# Patient Record
Sex: Female | Born: 1990 | Race: Black or African American | Hispanic: No | Marital: Single | Smoking: Current every day smoker
Health system: Southern US, Community
[De-identification: ages and names within clinical notes are randomized; demographics above are authoritative.]

## PROBLEM LIST (undated history)

## (undated) DIAGNOSIS — F32A Depression, unspecified: Secondary | ICD-10-CM

## (undated) DIAGNOSIS — F909 Attention-deficit hyperactivity disorder, unspecified type: Secondary | ICD-10-CM

## (undated) DIAGNOSIS — F329 Major depressive disorder, single episode, unspecified: Secondary | ICD-10-CM

---

## 2014-10-16 ENCOUNTER — Emergency Department (HOSPITAL_COMMUNITY)
Admission: EM | Admit: 2014-10-16 | Discharge: 2014-10-17 | Disposition: A | Discharge status: Home / Self Care | Payer: Self-pay | Attending: Emergency Medicine | Admitting: Emergency Medicine

## 2014-10-16 ENCOUNTER — Encounter (HOSPITAL_COMMUNITY): Payer: Self-pay | Admitting: Adult Health

## 2014-10-16 DIAGNOSIS — Z202 Contact with and (suspected) exposure to infections with a predominantly sexual mode of transmission: Secondary | ICD-10-CM | POA: Insufficient documentation

## 2014-10-16 DIAGNOSIS — A5901 Trichomonal vulvovaginitis: Secondary | ICD-10-CM | POA: Insufficient documentation

## 2014-10-16 DIAGNOSIS — N898 Other specified noninflammatory disorders of vagina: Secondary | ICD-10-CM

## 2014-10-16 DIAGNOSIS — K088 Other specified disorders of teeth and supporting structures: Secondary | ICD-10-CM | POA: Insufficient documentation

## 2014-10-16 DIAGNOSIS — Z720 Tobacco use: Secondary | ICD-10-CM | POA: Insufficient documentation

## 2014-10-16 DIAGNOSIS — A599 Trichomoniasis, unspecified: Secondary | ICD-10-CM

## 2014-10-16 DIAGNOSIS — Z3202 Encounter for pregnancy test, result negative: Secondary | ICD-10-CM | POA: Insufficient documentation

## 2014-10-16 DIAGNOSIS — K029 Dental caries, unspecified: Secondary | ICD-10-CM | POA: Insufficient documentation

## 2014-10-16 DIAGNOSIS — A64 Unspecified sexually transmitted disease: Secondary | ICD-10-CM | POA: Insufficient documentation

## 2014-10-16 LAB — POC URINE PREG, ED: Preg Test, Ur: NEGATIVE

## 2014-10-16 MED ORDER — LIDOCAINE HCL (PF) 1 % IJ SOLN
INTRAMUSCULAR | Status: AC
  Filled 2014-10-16: qty 5

## 2014-10-16 MED ORDER — CEFTRIAXONE SODIUM 250 MG IJ SOLR
250.0000 mg | Freq: Once | INTRAMUSCULAR | Status: AC
  Administered 2014-10-16: 250 mg via INTRAMUSCULAR
  Filled 2014-10-16: qty 250

## 2014-10-16 MED ORDER — AZITHROMYCIN 250 MG PO TABS
1000.0000 mg | ORAL_TABLET | Freq: Once | ORAL | Status: AC
  Administered 2014-10-16: 1000 mg via ORAL
  Filled 2014-10-16: qty 4

## 2014-10-16 MED ORDER — HYDROCODONE-ACETAMINOPHEN 5-325 MG PO TABS
2.0000 | ORAL_TABLET | Freq: Once | ORAL | Status: AC
  Administered 2014-10-16: 2 via ORAL
  Filled 2014-10-16: qty 2

## 2014-10-16 NOTE — ED Notes (Signed)
Presents with 2 weeks of white, foul odor, vaginal discharge and lower abdominal pain. Last period one week ago. Also c/o right lower dental pain. Denies urinary symptoms.

## 2014-10-16 NOTE — ED Provider Notes (Signed)
CSN: [id number]     Arrival date & time 10/16/14  1917 History   First MD Initiated Contact with Patient 10/16/14 2305     Chief Complaint  Patient presents with  . Vaginal Discharge  . Dental Pain     (Consider location/radiation/quality/duration/timing/severity/associated sxs/prior Treatment) The history is provided by the patient and medical records. No language interpreter was used.     Andrea Guerra is a 24 y.o. female  with no major medical problems presents to the Emergency Department complaining of gradual, persistent, progressively worsening vaginal discharge onset 2 weeks ago.  Pt reports discharge is clear/white and has an odor.  Pt reports that 3 days ago she was informed by her sexual partner that he has Chlamydia.  Pt reports previous hx of STD 1 year ago.  Pt denies fever, chills, nausea, vomiting, abd pain, diarrhea.  LMP:  1 week ago.  No aggravating or alleviating factors.    Pt also c/o right lower dental pain onset several days ago.  Pt reports that she took some of her father's penicillin and percocet that helped with the swelling and pain.  Pt reports the tooth in question is broken and has been for some time.  Pt reports she has not seen a dentist in > 2 years.  Eating makes the symptoms worst.  Pt reports associated right sided facial swelling that is improving.    History reviewed. No pertinent past medical history. History reviewed. No pertinent past surgical history. History reviewed. No pertinent family history. History  Substance Use Topics  . Smoking status: Current Every Day Smoker    Types: Cigarettes  . Smokeless tobacco: Not on file  . Alcohol Use: No   OB History    No data available     Review of Systems  Constitutional: Negative for fever, chills, diaphoresis, appetite change, fatigue and unexpected weight change.  HENT: Positive for dental problem and facial swelling. Negative for drooling, ear pain, mouth sores, nosebleeds, postnasal drip,  rhinorrhea and trouble swallowing.   Eyes: Negative for pain, redness and visual disturbance.  Respiratory: Negative for cough, chest tightness, shortness of breath and wheezing.   Cardiovascular: Negative for chest pain.  Gastrointestinal: Negative for nausea, vomiting, abdominal pain, diarrhea and constipation.  Endocrine: Negative for polydipsia, polyphagia and polyuria.  Genitourinary: Positive for vaginal discharge. Negative for dysuria, urgency, frequency and hematuria.  Musculoskeletal: Negative for back pain, neck pain and neck stiffness.  Skin: Negative for color change and rash.  Allergic/Immunologic: Negative for immunocompromised state.  Neurological: Negative for syncope, weakness, light-headedness and headaches.  Hematological: Does not bruise/bleed easily.  Psychiatric/Behavioral: Negative for sleep disturbance. The patient is not nervous/anxious.   All other systems reviewed and are negative.     Allergies  Review of patient's allergies indicates no known allergies.  Home Medications   Prior to Admission medications   Medication Sig Start Date End Date Taking? Authorizing Provider  HYDROcodone-acetaminophen (NORCO/VICODIN) 5-325 MG per tablet Take 1-2 tablets by mouth every 4 (four) hours as needed. 10/17/14   Jeralynn Vaquera, PA-C  penicillin v potassium (VEETID) 500 MG tablet Take 1 tablet (500 mg total) by mouth 3 (three) times daily. 10/17/14   Aymee Fomby, PA-C   BP 107/63 mmHg  Pulse 63  Temp(Src) 98.4 F (36.9 C) (Oral)  Resp 16  Ht  (1.6 m)  SpO2 99%  LMP 10/10/2014 (Approximate) Physical Exam  Constitutional: She appears well-developed and well-nourished. No distress.  HENT:  Head: Normocephalic and atraumatic.  Right Ear: Tympanic membrane, external ear and ear canal normal.  Left Ear: Tympanic membrane, external ear and ear canal normal.  Nose: Nose normal. Right sinus exhibits no maxillary sinus tenderness and no frontal sinus  tenderness. Left sinus exhibits no maxillary sinus tenderness and no frontal sinus tenderness.  Mouth/Throat: Uvula is midline, oropharynx is clear and moist and mucous membranes are normal. No oral lesions. Abnormal dentition. Dental caries present. No uvula swelling or lacerations. No oropharyngeal exudate, posterior oropharyngeal edema, posterior oropharyngeal erythema or tonsillar abscesses.  No gingival swelling, fluctuance or induration; no erythema of the gingiva No gross abscess Tooth #1 and 32 with significant decay from dental caries  Eyes: Conjunctivae are normal. Pupils are equal, round, and reactive to light. Right eye exhibits no discharge. Left eye exhibits no discharge.  Neck: Normal range of motion. Neck supple.  No stridor Handling secretions without difficulty No nuchal rigidity No cervical lymphadenopathy   Cardiovascular: Normal rate, regular rhythm, normal heart sounds and intact distal pulses.   No murmur heard. Pulmonary/Chest: Effort normal and breath sounds normal. No respiratory distress. She has no wheezes.  Equal chest rise  Abdominal: Soft. Bowel sounds are normal. She exhibits no distension. There is no tenderness. There is no rebound and no guarding. Hernia confirmed negative in the right inguinal area and confirmed negative in the left inguinal area.  Genitourinary: Uterus normal. No labial fusion. There is no rash, tenderness or lesion on the right labia. There is no rash, tenderness or lesion on the left labia. Uterus is not deviated, not enlarged, not fixed and not tender. Cervix exhibits no motion tenderness, no discharge and no friability. Right adnexum displays no mass, no tenderness and no fullness. Left adnexum displays no mass, no tenderness and no fullness. No erythema, tenderness or bleeding in the vagina. No foreign body around the vagina. No signs of injury around the vagina. Vaginal discharge (Copious, thin, white/green, malodorous) found.   Musculoskeletal: Normal range of motion. She exhibits no edema.  Lymphadenopathy:    She has no cervical adenopathy.       Right: No inguinal adenopathy present.       Left: No inguinal adenopathy present.  Neurological: She is alert.  Skin: Skin is warm and dry. She is not diaphoretic. No erythema.  Psychiatric: She has a normal mood and affect.  Nursing note and vitals reviewed.   ED Course  Procedures (including critical care time) Labs Review Labs Reviewed  WET PREP, GENITAL - Abnormal; Notable for the following:    Trich, Wet Prep MODERATE (*)    WBC, Wet Prep HPF POC MODERATE (*)    All other components within normal limits  RPR  HIV ANTIBODY (ROUTINE TESTING)  POC URINE PREG, ED  GC/CHLAMYDIA PROBE AMP ([facility])    Imaging Review No results found.   EKG Interpretation None      MDM   Final diagnoses:  STD (female)  STD exposure  Vaginal discharge  Pain due to dental caries  Trichomonas infection   Andrea Guerra presents with vaginal discharge and dental pain.  Patient with toothache.  No gross abscess.  Exam unconcerning for Ludwig's angina or spread of infection.  Will treat with penicillin and pain medicine.  Urged patient to follow-up with dentist.    Patient with exposure to chlamydia. Pt understands that they have GC/Chlamydia cultures pending and that they will need to inform all sexual partners if results return positive. Pt has been treated with azithromycin and rocephin  due to pts history, pelvic exam, and wet prep with increased WBCs. Pt not concerning for PID because hemodynamically stable and no cervical motion tenderness on pelvic exam.  Patient also with trichomonas, treated here in the emergency department with metronidazole. Patient advised not to drink alcohol with this medication. Patient to be discharged with instructions to follow up with OBGYN. Discussed importance of using protection when sexually active.  I have personally reviewed  patient's vitals, nursing note and any pertinent labs or imaging.  I performed an undressed physical exam.    It has been determined that no acute conditions requiring further emergency intervention are present at this time. The patient/guardian have been advised of the diagnosis and plan. I reviewed all labs and imaging including any potential incidental findings. We have discussed signs and symptoms that warrant return to the ED and they are listed in the discharge instructions.    Vital signs are stable at discharge.   BP 107/63 mmHg  Pulse 63  Temp(Src) 98.4 F (36.9 C) (Oral)  Resp 16  Ht 5\' 3"  (1.6 m)  SpO2 99%  LMP 10/10/2014 (Approximate)        Andrea ForthHannah Beautiful Pensyl, PA-C 10/17/14 0034  Tilden FossaElizabeth Rees, MD 10/17/14 [phone]

## 2014-10-17 LAB — WET PREP, GENITAL
Clue Cells Wet Prep HPF POC: NONE SEEN
Yeast Wet Prep HPF POC: NONE SEEN

## 2014-10-17 LAB — HIV ANTIBODY (ROUTINE TESTING W REFLEX): HIV Screen 4th Generation wRfx: NONREACTIVE

## 2014-10-17 LAB — RPR: RPR Ser Ql: NONREACTIVE

## 2014-10-17 MED ORDER — PENICILLIN V POTASSIUM 500 MG PO TABS: 500.0000 mg | ORAL_TABLET | Freq: Three times a day (TID) | ORAL | Status: DC

## 2014-10-17 MED ORDER — HYDROCODONE-ACETAMINOPHEN 5-325 MG PO TABS: 1.0000 | ORAL_TABLET | ORAL | Status: DC | PRN

## 2014-10-17 MED ORDER — METRONIDAZOLE 500 MG PO TABS
2000.0000 mg | ORAL_TABLET | Freq: Once | ORAL | Status: AC
  Administered 2014-10-17: 2000 mg via ORAL
  Filled 2014-10-17: qty 4

## 2014-10-17 NOTE — Discharge Instructions (Signed)
1. Medications: vicodin, penicillin, usual home medications 2. Treatment: rest, drink plenty of fluids, take medications as prescribed 3. Follow Up: Please followup with dentistry within 1 week for discussion of your diagnoses and further evaluation after today's visit; if you do not have a primary care doctor use the resource guide provided to find one; Return to the ER for high fevers, difficulty breathing, difficulty swallowing or other concerning symptoms  You have been tested for HIV, syphilis, chlamydia and gonorrhea.  These results will be available in approximately 3 days.  Please inform all sexual partners if you test positive for any of these diseases.    Emergency Department Resource Guide 1) Find a Doctor and Pay Out of Pocket Although you won't have to find out who is covered by your insurance plan, it is a good idea to ask around and get recommendations. You will then need to call the office and see if the doctor you have chosen will accept you as a new patient and what types of options they offer for patients who are self-pay. Some doctors offer discounts or will set up payment plans for their patients who do not have insurance, but you will need to ask so you aren't surprised when you get to your appointment.  2) Contact Your Local Health Department Not all health departments have doctors that can see patients for sick visits, but many do, so it is worth a call to see if yours does. If you don't know where your local health department is, you can check in your phone book. The CDC also has a tool to help you locate your state's health department, and many state websites also have listings of all of their local health departments.  3) Find a Walk-in Clinic If your illness is not likely to be very severe or complicated, you may want to try a walk in clinic. These are popping up all over the country in pharmacies, drugstores, and shopping centers. They're usually staffed by nurse  practitioners or physician assistants that have been trained to treat common illnesses and complaints. They're usually fairly quick and inexpensive. However, if you have serious medical issues or chronic medical problems, these are probably not your best option.  No Primary Care Doctor: - Call Health Connect at  [phone] - they can help you locate a primary care doctor that  accepts your insurance, provides certain services, etc. - Physician Referral Service- [phone]  Chronic Pain Problems: Organization         Address  Phone   Notes  [facility]  [phone] Patients need to be referred by their primary care doctor.   Medication Assistance: Organization         Address  Phone   Notes  [facility] [address] [place], [place] [address] [phone] --Must be a resident of [place] -- Must have NO insurance coverage whatsoever (no Medicaid/ Medicare, etc.) -- The pt. MUST have a primary care doctor that directs their care regularly and follows them in the community   Med[facility]ist  [phone]   [employer]  [phone]    Agencies that provide inexpensive medical care: Organization         Address  Phone   Notes  [facility]  [phone]   [facility]    [phone]   [facility] [address] [place], [place] [address] [phone]  [facility] [address] [place], [place] [phone]   [facility]anned Parenthood    [phone]   [facility]    [phone]   Community Health and [facility]  [address], [place] Phone:  [phone], Fax:  [phone] Hours of Operation:  9 am - 6 pm, M-F.  Also accepts Medicaid/Medicare and self-pay.  [facility]  [address], [place] Phone: [phone], Fax: [phone]. Hours of Operation:  8:30 am -  5:30 pm, M-F.  Also accepts Medicaid and self-pay.  [facility] [place] [address] [place] Phone: [phone]   [facility] [address] Natasha Bence [place], [place] [phone], Ext. [phone] Mondays & Thursdays: 7-9 AM.  First 15 patients are seen on a first come, first serve basis.    Medicaid-accepting [facility] Providers:  Organization         Address  Phone   Notes  [facility] [address], [place] [phone] Also accepts self-pay patients.  [facility] [place] [address] Laurell Josephs [place], [place]  [phone]   [facility] [address], [place] [phone]   [facility] [address] [place] [phone]   Renaye Rakers [address], [place]   [phone] Only accepts [place] patients after they have their name applied to their card.   Self-Pay (no insurance) in [facility]:  Organization         Address  Phone   Notes  Sickle Cell Patients, [facility] [address] [place], [place] [phone]   [facility] [address] [place], [place] [phone]   [facility]  [address], [place] [phone]   [facility]/Dr. Osei-Bonsu  [address], [place] or [address], [place] [phone] Phone number for both [place] and [place] locations is the same.  Urgent Medical and [facility] [address] [place] [phone]   [facility] [address] [place] or [address] [phone]   [facility] [address], [place] [phone], phone; [phone], fax Sees patients 1st and 3rd Saturday of every month.  Must not qualify for public or private insurance (i.e. Medicaid, Medicare, [place] Health Choice,  Veterans' Benefits)  Household income should be no more than 200% of the poverty level The clinic cannot treat you if you are pregnant or think you are pregnant  Sexually transmitted diseases are not treated at the clinic.    Dental Care: Organization         Address  Phone  Notes  [facility] Department of [facility] [address] [place], [place] [phone] Accepts children up to age 54 who are enrolled in [place] or [place] Health Choice; pregnant women with a Medicaid card; and children who have applied for Medicaid or [place] Health Choice, but were declined, whose parents can pay a reduced fee at time of service.  [facility] Department of [facility]  [address], [place] [phone] Accepts children up to age 46 who are enrolled in [place] or [place] Health Choice; pregnant women with a Medicaid card; and  children who have applied for Medicaid or [place] Health Choice, but were declined, whose parents can pay a reduced fee at time of service.  [facility]  [address] [place], [place] [phone] Patients are seen by appointment only. Walk-ins are not accepted. [facility] will see patients 76 years of age and older. Monday - Tuesday (8am-5pm) Most Wednesdays (8:30-5pm) $30 per visit, cash only  [facility]  [address], [facility] [phone] Patients are seen by appointment only. Walk-ins are not accepted. [facility] will see patients 75 years of age and older. One Wednesday Evening (Monthly: Volunteer Based).  $30 per visit, cash only  [employer]  [phone] for adults; Children under age 80, call Graduate Pediatric Dentistry at [phone]. Children aged 12-14, please call [phone] to request a pediatric application.  Dental services are provided in all areas of dental care including fillings, crowns and bridges, complete and  partial dentures, implants, gum treatment, root canals, and extractions. Preventive care is also provided. Treatment is provided to both adults and children. Patients are selected via a lottery and there is often a waiting list.   [facility] [address], [place]  [phone] [email or web address]   [facility] [address] [place], [place] [phone], Ext. [phone] Second and Fourth Thursday of each month, opens at 6:30 AM; Clinic ends at 9 AM.  Patients are seen on a first-come first-served basis, and a limited number are seen during each clinic.   [facility]  [address] Ether Griffins [place], [place] [phone]   Eligibility Requirements You must have lived in [place], [place], or [place] counties for at least the last three months.   You cannot be eligible for state or federal sponsored [place], including [employer], [place], or [employer].   You generally cannot be eligible for healthcare insurance through your employer.    How to apply: Eligibility screenings are held every Tuesday and Wednesday afternoon from 1:00 pm until 4:00 pm. You do not need an appointment for the interview!  [facility] [address], [place], [place] [phone]   [facility] Health Department  [phone]   [facility] Health Department  [phone]   [facility] Health Department  [phone]    Behavioral Health Resources in the Community: Intensive Outpatient Programs Organization         Address  Phone  Notes  [facility] [address], [place], [place] [phone]   [facility] [address], [place], [place] [phone]   [facility] [address] [place], [place]  [phone]   [facility] [address]  [place], [place] [phone] or [phone]   Substance Abuse Resources Organization          Address  Phone  Notes  [facility]  [phone]   [facility]  [phone]   [facility] [place]  [phone]   Floydene Flock  [phone]   Residential & Outpatient Substance Abuse Program  [phone]   Psychological Services Organization         Address  Phone  Notes  [facility]  [phone]   [facility]  [phone]   [facility] [address] [place] [phone] or [phone]    Mobile Crisis Teams Organization  Address  Phone  Notes  [facility], Mobile Crisis Care Unit  [phone]   [facility]  [address] [place], [place] [phone]   [facility] [address] [place] [phone]    Self-Help/Support Groups Organization         Address  Phone             Notes  [facility] [place] - variety of support groups  336- [phone] Call for more information  Narcotics Anonymous (NA), [facility] [address], [place]  2 meetings at this location   Statisticianesidential Treatment Programs Organization         Address  Phone  Notes  [facility] [address],    [place]  [phone]   [facility]  [address], [place] [phone], [place], [place] [phone]   [facility] [address], [place] [phone] Admissions: 8am-3pm M-F  [facility] [address]    [place], [place] [phone]   [facility] [address], [place], [place] [phone]   [facility] [address]  [place], [place] [phone]   [facility] - Intensive Outpatient [address], [place], [place] [phone]   [facility] ([facility] [address]  [place], [place] [phone] or [phone]   [facility] ([facility]) [address] [place], [place]  [phone] Accepts Medicaid  [facility] [address]  [place] [phone] Substance Abuse/Addiction Treatment   [facility] Behavioral Health Resources Organization         Address  Phone  Notes  [facility]  [phone]) [phone]   Angie FavaJulie Brannon, PhD [address] [place], [place]   [phone]) [phone] or [phone](336) [phone]   [facility]   [address] [place], [place] [phone]) [phone]   [facility] [address], [place], [place] [phone]) [phone] Insurance/Medicaid/sponsorship through [facility]  [facility] Families [address]                                    [place], [place] [phone]) [phone] Therapy/tele-psych/case  [facility] [address]   [place], [place] [phone]) [phone]    Dr. Lolly MustacheArfeen  [phone]) [phone]   [facility]  [employer] [facility] 1) [address], [facility] 2) [address], [place] 3)  [address], [place] [phone]) [phone]) [phone]  [phone]   [facility] Child Abuse Hotline [phone](336) [phone] or [phone](336) [phone] (After Hours)

## 2014-10-18 LAB — GC/CHLAMYDIA PROBE AMP (~~LOC~~) NOT AT ARMC
Chlamydia: NEGATIVE
Neisseria Gonorrhea: NEGATIVE

## 2014-12-06 ENCOUNTER — Emergency Department
Admission: EM | Admit: 2014-12-06 | Discharge: 2014-12-06 | Disposition: A | Discharge status: Home / Self Care | Payer: Self-pay | Attending: Emergency Medicine | Admitting: Emergency Medicine

## 2014-12-06 ENCOUNTER — Encounter: Payer: Self-pay | Admitting: Emergency Medicine

## 2014-12-06 DIAGNOSIS — T148 Other injury of unspecified body region: Secondary | ICD-10-CM | POA: Insufficient documentation

## 2014-12-06 DIAGNOSIS — Z720 Tobacco use: Secondary | ICD-10-CM | POA: Insufficient documentation

## 2014-12-06 DIAGNOSIS — M791 Myalgia: Secondary | ICD-10-CM | POA: Insufficient documentation

## 2014-12-06 DIAGNOSIS — Z792 Long term (current) use of antibiotics: Secondary | ICD-10-CM | POA: Insufficient documentation

## 2014-12-06 DIAGNOSIS — Z4802 Encounter for removal of sutures: Secondary | ICD-10-CM | POA: Insufficient documentation

## 2014-12-06 MED ORDER — OXYCODONE-ACETAMINOPHEN 5-325 MG PO TABS: 1.0000 | ORAL_TABLET | ORAL | Status: DC | PRN

## 2014-12-06 NOTE — ED Notes (Signed)
Pt here for suture removal under left eye
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
## 2014-12-06 NOTE — Discharge Instructions (Signed)

## 2015-03-06 DIAGNOSIS — Z720 Tobacco use: Secondary | ICD-10-CM | POA: Insufficient documentation

## 2015-03-06 DIAGNOSIS — K088 Other specified disorders of teeth and supporting structures: Secondary | ICD-10-CM | POA: Insufficient documentation

## 2015-03-06 NOTE — ED Notes (Signed)
Pt reports having pain on her right upper and lower molars for sometime. Reports developed swelling to the area yesterday and worse pain. Talking in full and complete sentences with no difficulty.

## 2015-03-07 ENCOUNTER — Emergency Department
Admission: EM | Admit: 2015-03-07 | Discharge: 2015-03-07 | Discharge status: Against Medical Advice | Payer: Self-pay | Attending: Emergency Medicine | Admitting: Emergency Medicine

## 2015-05-05 ENCOUNTER — Encounter: Payer: Self-pay | Admitting: Emergency Medicine

## 2015-05-05 ENCOUNTER — Emergency Department
Admission: EM | Admit: 2015-05-05 | Discharge: 2015-05-06 | Disposition: A | Discharge status: Home / Self Care | Payer: Self-pay | Attending: Emergency Medicine | Admitting: Emergency Medicine

## 2015-05-05 DIAGNOSIS — K029 Dental caries, unspecified: Secondary | ICD-10-CM | POA: Insufficient documentation

## 2015-05-05 DIAGNOSIS — Z720 Tobacco use: Secondary | ICD-10-CM | POA: Insufficient documentation

## 2015-05-05 NOTE — ED Notes (Signed)
Pt reports pain and swelling to right side of mouth x 2 days.  Pt tx for same before in may and tx with antibiotics and pain medication.  Upon inspection, back molars on right side of mouth, top and bottom, eroded away.  Pt NAD at this time, respirations equal and unlabored, skin warm and dry.

## 2015-05-05 NOTE — ED Notes (Signed)
Pt presents to ED with right sided dental pain. Seen in this ED approx a month ago and was given antibiotics and the pain went away. Pain returned today. Reports fever earlier today.

## 2015-05-06 MED ORDER — TRAMADOL HCL 50 MG PO TABS: 50.0000 mg | ORAL_TABLET | Freq: Four times a day (QID) | ORAL | Status: AC | PRN

## 2015-05-06 MED ORDER — TRAMADOL HCL 50 MG PO TABS
100.0000 mg | ORAL_TABLET | Freq: Once | ORAL | Status: AC
  Administered 2015-05-06: 100 mg via ORAL
  Filled 2015-05-06: qty 2

## 2015-05-06 MED ORDER — PENICILLIN V POTASSIUM 250 MG PO TABS
500.0000 mg | ORAL_TABLET | Freq: Once | ORAL | Status: AC
  Administered 2015-05-06: 500 mg via ORAL
  Filled 2015-05-06: qty 2

## 2015-05-06 MED ORDER — PENICILLIN V POTASSIUM 500 MG PO TABS: 500.0000 mg | ORAL_TABLET | Freq: Four times a day (QID) | ORAL | Status: DC

## 2015-05-06 NOTE — ED Provider Notes (Signed)
[facility] Emergency Department Provider Note  Time seen: 12:32 AM  I have reviewed the triage vital signs and the nursing notes.   HISTORY  Chief Complaint Dental Pain    HPI Andrea Guerra is a 24 y.o. female with no past medical history presents the emergency department with dental pain. According to the patient for the past several months she has intermittently had dental pain in her right lower and upper molars. For the past 2 days she states it is been worse, much worse with any type of chewing. Describes her pain as moderate. Has not seen a dentist. Denies fever, nausea, vomiting.    History reviewed. No pertinent past medical history.  There are no active problems to display for this patient.   History reviewed. No pertinent past surgical history.  Current Outpatient Rx  Name  Route  Sig  Dispense  Refill  . HYDROcodone-acetaminophen (NORCO/VICODIN) 5-325 MG per tablet   Oral   Take 1-2 tablets by mouth every 4 (four) hours as needed.   12 tablet   0   . oxyCODONE-acetaminophen (ROXICET) 5-325 MG per tablet   Oral   Take 1-2 tablets by mouth every 4 (four) hours as needed for severe pain.   8 tablet   0   . penicillin v potassium (VEETID) 500 MG tablet   Oral   Take 1 tablet (500 mg total) by mouth 3 (three) times daily.   30 tablet   0     Allergies Review of patient's allergies indicates no known allergies.  No family history on file.  Social History Social History  Substance Use Topics  . Smoking status: Current Every Day Smoker -- 1.00 packs/day    Types: Cigarettes  . Smokeless tobacco: None  . Alcohol Use: Yes    Review of Systems Constitutional: Negative for fever. ENT: Positive for dental pain Cardiovascular: Negative for chest pain. Respiratory: Negative for shortness of breath. Gastrointestinal: Negative for abdominal pain 10-point ROS otherwise  negative.  ____________________________________________   PHYSICAL EXAM:  VITAL SIGNS: ED Triage Vitals  Enc Vitals Group     BP 05/05/15 2329 131/88 mmHg     Pulse Rate 05/05/15 2329 81     Resp --      Temp 05/05/15 2329 98.1 F (36.7 C)     Temp Source 05/05/15 2329 Oral     SpO2 05/05/15 2329 100 %     Weight 05/05/15 2329 158 lb (71.668 kg)     Height 05/05/15 2329 5\' 3"  (1.6 m)     Head Cir --      Peak Flow --      Pain Score 05/05/15 2329 8     Pain Loc --      Pain Edu? --      Excl. in GC? --    Constitutional: Alert and oriented. Well appearing and in no distress. Eyes: Normal exam ENT   Head: Normocephalic and atraumatic.   Mouth/Throat: Mucous membranes are moist. Dental caries to right lower molar as well as right upper molar. No signs of abscess. Cardiovascular: Normal rate, regular rhythm. No murmur Respiratory: Normal respiratory effort without tachypnea nor retractions. Breath sounds are clear  Gastrointestinal: Soft and nontender. No distention.  Musculoskeletal: Nontender with normal range of motion in all extremities.  Neurologic:  Normal speech and language. No gross focal neurologic deficits Psychiatric: Mood and affect are normal. Speech and behavior are normal.  ____________________________________________    INITIAL IMPRESSION / ASSESSMENT  AND PLAN / ED COURSE  Pertinent labs & imaging results that were available during my care of the patient were reviewed by me and considered in my medical decision making (see chart for details).  Patient presents for 2 days of fairly constant right lower molar pain. No signs of abscess on exam. We will dose penicillin and Ultram, send the patient home with the same as well as the list of dental clinics in the area. Patient agreeable.  ____________________________________________   FINAL CLINICAL IMPRESSION(S) / ED DIAGNOSES  Dental pain   Minna Antis, MD 05/06/15 [phone]

## 2015-05-06 NOTE — Discharge Instructions (Signed)
OPTIONS FOR DENTAL FOLLOW UP CARE ° °[place] [facility] and Human Services - Local Safety Net Dental Clinics °http://www.ncdhhs.gov/dph/oralhealth/services/safetynetclinics.htm °  °[facility] (336-562-3123) ° °[facility] [place] ([phone]) ° °[facility] ([phone] ext [phone]) ° °[facility] ([phone]) ° °[facility] ([phone]) °This clinic caters to the indigent population and is on a lottery system. °Location: °[facility], [address], [place] °Clinic Hours: °Wednesdays from 6pm - 9pm, patients seen by a lottery system. °For dates, call or go to www.med.[facility].edu/[facility]/patients/Dental-[facility] °Services: °Cleanings, fillings and simple extractions. °Payment Options: °DENTAL WORK IS FREE OF CHARGE. Bring proof of income or support. °Best way to get seen: °Arrive at 5:15 pm - this is a lottery, NOT first come/first serve, so arriving earlier will not increase your chances of being seen. °  °  °[facility] °[phone] °Select option 1 for emergencies °  °Location: °[facility], [address], [place] °Clinic Hours: °No walk-ins accepted - call the day before to schedule an appointment. °Check in times are 9:30 am and 1:30 pm. °Services: °Simple extractions, temporary fillings, pulpectomy/pulp debridement, uncomplicated abscess drainage. °Payment Options: °PAYMENT IS DUE AT THE TIME OF SERVICE.  Fee is usually $100-200, additional surgical procedures (e.g. abscess drainage) may be extra. °Cash, checks, Visa/MasterCard accepted.  Can file Medicaid if patient is covered for dental - patient should call case worker to check. °No discount for [facility] patients. °Best way to get seen: °MUST call the day before and get onto the schedule. Can usually be seen the next 1-2 days. No walk-ins accepted. °  °  °[facility] °[phone] °   °Location: °[facility], [address], [place] °Clinic Hours: °M, W, Th, F 8am or 1:30pm, Tues 9a or 1:30 - first come/first served. °Services: °Simple extractions, temporary fillings, uncomplicated abscess drainage.  You do not need to be an [place] resident. °Payment Options: °PAYMENT IS DUE AT THE TIME OF SERVICE. °Dental insurance, otherwise sliding scale - bring proof of income or support. °Depending on income and treatment needed, cost is usually $50-200. °Best way to get seen: °Arrive early as it is first come/first served. °  °  °[facility] °[phone] °  °Location: °[address] °Clinic Hours: °Mon-Thu 8a-5p °Services: °Most basic dental services including extractions and fillings. °Payment Options: °PAYMENT IS DUE AT THE TIME OF SERVICE. °Sliding scale, up to 50% off - bring proof if income or support. °Medicaid with dental option accepted. °Best way to get seen: °Call to schedule an appointment, can usually be seen within 2 weeks OR they will try to see walk-ins - show up at 8a or 2p (you may have to wait). °  °  °[facility] °[phone] °[place] RESIDENTS ONLY °  °Location: °[facility], [address], [place],  [address] °Clinic Hours: By appointment only. °Monday - Thursday 8am-5pm, Friday 8am-12pm °Services: Cleanings, fillings, extractions. °Payment Options: °PAYMENT IS DUE AT THE TIME OF SERVICE. °Cash, Visa or MasterCard. Sliding scale - $30 minimum per service. °Best way to get seen: °Come in to office, complete packet and make an appointment - need proof of income °or support monies for each household member and proof of [place] residence. °Usually takes about a month to get in. °  °  °[facility] °[phone] °  °Location: °[address]   [facility] Hours: Walk-in Urgent Care Dental Services are offered Monday-Friday  mornings only. The numbers of emergencies accepted daily is limited to the number of providers available. Maximum 15 - Mondays, Wednesdays & Thursdays Maximum 10 - Tuesdays & Fridays Services: You do not need to [place] resident to be seen for a dental emergency. Emergencies are defined as pain, swelling, abnormal bleeding, or dental trauma. Walkins will receive x-rays if needed. NOTE: Dental cleaning is not an emergency. Payment Options: PAYMENT IS DUE AT THE TIME OF SERVICE. Minimum co-pay is $40.00 for uninsured patients. Minimum co-pay is $3.00 for Medicaid with dental coverage. Dental Insurance is accepted and must be presented at time of visit. Medicare does not cover dental. Forms of payment: Cash, credit card, checks. Best way to get seen: If not previously registered with the clinic, walk-in dental registration begins at 7:15 am and is on a first come/first serve basis. If previously registered with the clinic, call to make an appointment.     [facility] [phone] [place] RESIDENTS ONLY   Location: [address], [place], [place] Clinic Hours: Mon-Thu 10a-2p Services: Extractions only! Payment Options: FREE (donations accepted) - bring proof of income or support Best way to get seen: Call and schedule an appointment OR come at 8am on the 1st Monday of every month (except for holidays) when it is first come/first served.     [facility] [phone]   Location: [address], [place] [facility] Hours: Friday mornings Services, Payment Options, Best way to get seen: Call for info    Dental Caries Dental caries is tooth decay. This decay can cause a hole in teeth (cavity) that can get bigger and deeper over time. HOME CARE  Brush and floss your teeth. Do this at least two times a day.  Use a fluoride toothpaste.  Use a mouth rinse if told by your dentist or doctor.  Eat less sugary and starchy foods. Drink less sugary  drinks.  Avoid snacking often on sugary and starchy foods. Avoid sipping often on sugary drinks.  Keep regular checkups and cleanings with your dentist.  Use fluoride supplements if told by your dentist or doctor.  Allow fluoride to be applied to teeth if told by your dentist or doctor.   This information is not intended to replace advice given to you by your health care provider. Make sure you discuss any questions you have with your health care provider.   Document Released: 04/10/2008 Document Revised: 07/23/2014 Document Reviewed: 07/04/2012 Elsevier Interactive Patient Education [employer]2016 Elsevier Inc.

## 2016-01-02 ENCOUNTER — Emergency Department (HOSPITAL_COMMUNITY)
Admission: EM | Admit: 2016-01-02 | Discharge: 2016-01-02 | Disposition: A | Discharge status: Home / Self Care | Payer: Self-pay | Attending: Emergency Medicine | Admitting: Emergency Medicine

## 2016-01-02 DIAGNOSIS — Z79899 Other long term (current) drug therapy: Secondary | ICD-10-CM | POA: Insufficient documentation

## 2016-01-02 DIAGNOSIS — R7401 Elevation of levels of liver transaminase levels: Secondary | ICD-10-CM

## 2016-01-02 DIAGNOSIS — R740 Nonspecific elevation of levels of transaminase and lactic acid dehydrogenase [LDH]: Secondary | ICD-10-CM | POA: Insufficient documentation

## 2016-01-02 DIAGNOSIS — F17210 Nicotine dependence, cigarettes, uncomplicated: Secondary | ICD-10-CM | POA: Insufficient documentation

## 2016-01-02 DIAGNOSIS — K0889 Other specified disorders of teeth and supporting structures: Secondary | ICD-10-CM | POA: Insufficient documentation

## 2016-01-02 LAB — COMPREHENSIVE METABOLIC PANEL
ALBUMIN: 3.8 g/dL (ref 3.5–5.0)
ALT: 60 U/L — ABNORMAL HIGH (ref 14–54)
ANION GAP: 3 — AB (ref 5–15)
AST: 43 U/L — ABNORMAL HIGH (ref 15–41)
Alkaline Phosphatase: 53 U/L (ref 38–126)
BUN: 9 mg/dL (ref 6–20)
CHLORIDE: 108 mmol/L (ref 101–111)
CO2: 26 mmol/L (ref 22–32)
Calcium: 9.6 mg/dL (ref 8.9–10.3)
Creatinine, Ser: 0.87 mg/dL (ref 0.44–1.00)
GFR calc non Af Amer: >60 mL/min (ref >60)
GLUCOSE: 98 mg/dL (ref 65–99)
POTASSIUM: 4.1 mmol/L (ref 3.5–5.1)
SODIUM: 137 mmol/L (ref 135–145)
Total Bilirubin: 0.4 mg/dL (ref 0.3–1.2)
Total Protein: 6.6 g/dL (ref 6.5–8.1)

## 2016-01-02 LAB — SALICYLATE LEVEL: Salicylate Lvl: <4 mg/dL (ref 2.8–30.0)

## 2016-01-02 LAB — CBC
HCT: 40.5 % (ref 36.0–46.0)
Hemoglobin: 13 g/dL (ref 12.0–15.0)
MCH: 31.3 pg (ref 26.0–34.0)
MCHC: 32.1 g/dL (ref 30.0–36.0)
MCV: 97.6 fL (ref 78.0–100.0)
Platelets: 316 10*3/uL (ref 150–400)
RBC: 4.15 MIL/uL (ref 3.87–5.11)
RDW: 12.5 % (ref 11.5–15.5)
WBC: 8 10*3/uL (ref 4.0–10.5)

## 2016-01-02 LAB — ACETAMINOPHEN LEVEL

## 2016-01-02 MED ORDER — OXYCODONE HCL 5 MG PO TABS: 5.0000 mg | ORAL_TABLET | Freq: Four times a day (QID) | ORAL | Status: DC | PRN

## 2016-01-02 MED ORDER — PENICILLIN V POTASSIUM 500 MG PO TABS: 500.0000 mg | ORAL_TABLET | Freq: Four times a day (QID) | ORAL | Status: AC

## 2016-01-02 MED ORDER — NAPROXEN 500 MG PO TABS: 500.0000 mg | ORAL_TABLET | Freq: Two times a day (BID) | ORAL | Status: DC

## 2016-01-02 MED ORDER — OXYCODONE HCL 5 MG PO TABS
5.0000 mg | ORAL_TABLET | Freq: Once | ORAL | Status: AC
  Administered 2016-01-02: 5 mg via ORAL
  Filled 2016-01-02: qty 1

## 2016-01-02 NOTE — ED Provider Notes (Signed)
CSN: [id number]     Arrival date & time 01/02/16  1429 History  By signing my name below, I, Andrea Guerra, attest that this documentation has been prepared under the direction and in the presence of Cheri Fowler, PA-C.  Electronically Signed: Gillis Ends. Lyn Hollingshead, ED Scribe. 01/02/2016. 4:56 PM.   Chief Complaint  Patient presents with  . toothache    . possible OD     The history is provided by the patient. No language interpreter was used.   HPI Comments: Andrea Guerra is a 25 y.o. female who presents to the Emergency Department complaining of gradual onset, constant, worsening right-sided top and bottom tooth pain x 2 weeks. She states she has associated jaw pain, jaw swelling, and mild headaches. Pt states pain is exacerbated when she eats solid food. She has taken multiple dosages of Ibuprofen, Tylenol, and Advil with no relief of pain. Pt states she has not been able to visit the dentist because she does not have insurance at the moment. Denies any fever, nausea, vomiting, neck pain, abdominal pain.  Per triage note, she was taking 4 tablets of APAP q 90 min and ibuprofen 800 mg q 2 hours.  No past medical history on file. No past surgical history on file. No family history on file. Social History  Substance Use Topics  . Smoking status: Current Every Day Smoker -- 1.00 packs/day    Types: Cigarettes  . Smokeless tobacco: Not on file  . Alcohol Use: Yes   OB History    No data available     Review of Systems  Constitutional: Negative for fever.  HENT: Positive for dental problem and facial swelling.   Gastrointestinal: Negative for nausea, vomiting and abdominal pain.  Musculoskeletal: Negative for neck pain.  All other systems reviewed and are negative.  Allergies  Review of patient's allergies indicates no known allergies.  Home Medications   Prior to Admission medications   Medication Sig Start Date End Date Taking? Authorizing Provider  acetaminophen (TYLENOL)  325 MG tablet Take 650 mg by mouth every 6 (six) hours as needed for mild pain, fever or headache.    Historical Provider, MD  HYDROcodone-acetaminophen (NORCO/VICODIN) 5-325 MG per tablet Take 1-2 tablets by mouth every 4 (four) hours as needed. 10/17/14   Hannah Muthersbaugh, PA-C  oxyCODONE-acetaminophen (ROXICET) 5-325 MG per tablet Take 1-2 tablets by mouth every 4 (four) hours as needed for severe pain. 12/06/14   Charmayne Sheer Beers, PA-C  penicillin v potassium (VEETID) 500 MG tablet Take 1 tablet (500 mg total) by mouth 4 (four) times daily. 05/06/15   Minna Antis, MD  traMADol (ULTRAM) 50 MG tablet Take 1 tablet (50 mg total) by mouth every 6 (six) hours as needed. 05/06/15 05/05/16  Minna Antis, MD   BP 110/70 mmHg  Pulse 66  Temp(Src) 98.4 F (36.9 C) (Oral)  Resp 16  SpO2 100% Physical Exam  Constitutional: She is oriented to person, place, and time. She appears well-developed and well-nourished.  HENT:  Head: Normocephalic and atraumatic.  Mouth/Throat: Uvula is midline, oropharynx is clear and moist and mucous membranes are normal. No trismus in the jaw. Abnormal dentition. Dental caries present.  No tongue swelling or facial swelling.  Airway patent. Patient tolerating secretions without difficulty.  Cracked tooth #1.  No obvious abscess.    Eyes: Conjunctivae are normal. No scleral icterus.  Neck: Normal range of motion. Neck supple.  No evidence of Ludwigs angina.  Cardiovascular: Normal rate, regular rhythm  and normal heart sounds.   Pulmonary/Chest: Effort normal and breath sounds normal.  Abdominal: Bowel sounds are normal. She exhibits no distension. There is no tenderness. There is no rebound and no guarding.  Musculoskeletal:  Moves all extremities spontaneously.  Lymphadenopathy:    She has no cervical adenopathy.  Neurological: She is alert and oriented to person, place, and time.  Speech clear without dysarthria.   Skin: Skin is warm and dry.  No  jaundice.     ED Course  Procedures (including critical care time) DIAGNOSTIC STUDIES: Oxygen Saturation is 96% on RA, adequate by my interpretation.    COORDINATION OF CARE: 4:55 PM-Discussed treatment plan which includes CBC panel and CMP with pt at bedside and pt agreed to plan. Will order Roxicodone.  Labs Review Labs Reviewed  COMPREHENSIVE METABOLIC PANEL - Abnormal; Notable for the following:    AST 43 (*)    ALT 60 (*)    Anion gap 3 (*)    All other components within normal limits  ACETAMINOPHEN LEVEL - Abnormal; Notable for the following:    Acetaminophen (Tylenol), Serum <10 (*)    All other components within normal limits  SALICYLATE LEVEL  CBC    Imaging Review No results found. I have personally reviewed and evaluated these images and lab results as part of my medical decision-making.   MDM   Final diagnoses:  Pain, dental  Elevated transaminase level    Suspect dental pain associated with dental infection and possible dental abscess with patient afebrile, non toxic appearing and swallowing secretions well. Doubt Ludwig angina or deep soft tissue infection.  I gave patient referral to dentist and stressed the importance of dental follow up for ultimate management of dental pain. Discussed return precautions.  Patient expresses understanding and agrees with plan.  I will also give penicillin VK and pain control. Discussed dangers of APAP OD and not exceeding the recommend doses on all OTC medications.  LFTs slightly elevated, serum APAP <10.  Last dose yesterday.  No abdominal pain, N/V.  Benign abdominal exam.  Patient is safe for discharge.   I personally performed the services described in this documentation, which was scribed in my presence. The recorded information has been reviewed and is accurate.    Cheri FowlerKayla Chere Babson, PA-C 01/02/16 1724  Cathren LaineKevin Steinl, MD 01/02/16 [phone]

## 2016-01-02 NOTE — ED Notes (Signed)
Declined W/C at D/C and was escorted to lobby by RN. 

## 2016-01-02 NOTE — Discharge Instructions (Signed)
Please follow up with a Dentist for further evaluation and management.   Check this website for free, low-income or sliding scale dental services in [place]. www.[email or web address].us   To find a dentist in the [place] or surrounding areas check this website: [email or web address]  Dental Pain   Dental pain may be caused by many things, including:  Tooth decay (cavities or caries). Cavities expose the nerve of your tooth to air and hot or cold temperatures. This can cause pain or discomfort.  Abscess or infection. A dental abscess is a collection of infected pus from a bacterial infection in the inner part of the tooth (pulp). It usually occurs at the end of the tooth's root.  Injury.  An unknown reason (idiopathic). Your pain may be mild or severe. It may only occur when:  You are chewing.  You are exposed to hot or cold temperature.  You are eating or drinking sugary foods or beverages, such as soda or candy. Your pain may also be constant.  HOME CARE INSTRUCTIONS  Watch your dental pain for any changes. The following actions may help to lessen any discomfort that you are feeling:  Take medicines only as directed by your dentist.  If you were prescribed an antibiotic medicine, finish all of it even if you start to feel better.  Keep all follow-up visits as directed by your dentist. This is important.  Do not apply heat to the outside of your face.  Rinse your mouth or gargle with salt water if directed by your dentist. This helps with pain and swelling.  You can make salt water by adding  tsp of salt to 1 cup of warm water. Apply ice to the painful area of your face:  Put ice in a plastic bag.  Place a towel between your skin and the bag.  Leave the ice on for 20 minutes, 2-3 times per day. Avoid foods or drinks that cause you pain, such as:  Very hot or very cold foods or drinks.  Sweet or sugary foods or drinks. SEEK MEDICAL CARE IF:  Your pain is not  controlled with medicines.  Your symptoms are worse.  You have new symptoms. SEEK IMMEDIATE MEDICAL CARE IF:  You are unable to open your mouth.  You are having trouble breathing or swallowing.  You have a fever.  Your face, neck, or jaw is swollen. This information is not intended to replace advice given to you by your health care provider. Make sure you discuss any questions you have with your health care provider.  Document Released: 07/02/2005 Document Revised: 11/16/2014 Document Reviewed: 06/28/2014  Elsevier Interactive Patient Education 2016 [employer]    [employer] Resource Guide Dental [employer] 211 is a great source of information about community services available.  Access by dialing 2-1-1 from anywhere in [place], or by website -  PooledIncome.plwww.nc211.org.   Other Local Resources (Updated 07/2015)  Dental  Care   Services    Phone Number and Address  Cost  [facility] For children 250 - 27 years of age:   Cleaning  Tooth brushing/flossing instruction  Sealants, fillings, crowns  Extractions  Emergency treatment  [phone] [address] [place], [place] [id number] Charges based on family income.  Medicaid and some insurance plans accepted.     [facility]  Cleaning  Sealants, fillings, crowns  Extractions  Emergency treatment [phone] [address] [place], [place]  Pregnant women 18 years  age or older with a Medicaid card  °[facility] [place] • Cleaning °• Sealants, fillings, crowns °• Extractions °• Emergency treatment [phone] °[address] °[place], [place] Pregnant women 18 years of age or older with a Medicaid card  °[facility] For children 0 - 21 years of age:  °• Cleaning °• Tooth brushing/flossing instruction °• Sealants, fillings,  crowns °• Extractions °• Emergency treatment °Limited orthodontic services for patients with Medicaid [phone] °[address] °[place], [place] [address] Medicaid and [place] Health Choice cover for children up to age 21 and pregnant women.  Parents of children up to age 21 without Medicaid pay a reduced fee at time of service.  °[facility] [place] For children 0 - 21 years of age:  °• Cleaning °• Tooth brushing/flossing instruction °• Sealants, fillings, crowns °• Extractions °• Emergency treatment °Limited orthodontic services for patients with Medicaid [phone] °[address] °[place], [place].  Medicaid and [place] Health Choice cover for children up to age 21 and pregnant women.  Parents of children up to age 21 without Medicaid pay a reduced fee.  °[facility] • Cleaning °• Sealants, fillings, crowns °• Extractions ° °Hours: Tuesdays and Thursdays, 4:15 - 8 pm [phone] °[address] °[place], [place] [address] Services free of charge to  County residents ages 18-64 who do not have health insurance, Medicare, Medicaid, or VA benefits and fall within federal poverty guidelines  °[facility] ° ° ° Provides dental care in addition to primary medical care, nutritional counseling, and pharmacy: °• Cleaning °• Sealants, fillings, crowns °• Extractions ° ° ° ° ° ° ° ° ° ° ° ° ° ° ° ° ° [phone] °[facility], [address] °[place], [place] ° °[phone] °[facility], [address] [place], [place] ° °[phone] °[facility] °[place], [place] ° °[phone] °[facility], [address] °[place], [place] ° °[phone] °[facility] °[address] °[place], [place] Accepts Medicaid, Medicare, most insurance.  Also provides services available to all with fees adjusted based on ability  to pay.    °[facility] • Cleaning °• Tooth brushing/flossing instruction °• Sealants, fillings, crowns °• Extractions °• Emergency treatment °Hours: Tuesdays, Thursdays, and Fridays from 8 am to 5 pm by appointment only. [phone] °[address] °[place], [place] [address] [place] residents with Medicaid (depending on eligibility) and children with [place] Health Choice - call for more information.  °[facility] • Extractions only ° °Hours: 2nd and 4th Thursday of each month from 6:30 am - 9 am.   [phone] ext. [phone] °[address] °[place], [place] [address] Ages 18 and older only.  Patients are seen on a first come, first served basis.  °[facility] • Cleanings °• Fillings °• Extractions °• Orthodontics °• Endodontics °• Implants/Crowns/Bridges °• Complete and partial dentures [phone] °[place], [place] Patients must complete an application for services.  There is often a waiting list.   ° °

## 2016-01-02 NOTE — ED Notes (Addendum)
C/o toothache on right back molars-- top and bottom. States tooth is broken. Facial swelling -- has been taking OTC tylenol/motrin-- has been taking tylenol 4 tablets every 90 minutes, and ibuprofen 800mg  every 2 hours-- for approx 1 week.

## 2016-05-27 ENCOUNTER — Emergency Department
Admission: EM | Admit: 2016-05-27 | Discharge: 2016-05-27 | Disposition: A | Discharge status: Home / Self Care | Payer: Self-pay | Attending: Emergency Medicine | Admitting: Emergency Medicine

## 2016-05-27 DIAGNOSIS — Z791 Long term (current) use of non-steroidal anti-inflammatories (NSAID): Secondary | ICD-10-CM | POA: Insufficient documentation

## 2016-05-27 DIAGNOSIS — K029 Dental caries, unspecified: Secondary | ICD-10-CM | POA: Insufficient documentation

## 2016-05-27 DIAGNOSIS — G8929 Other chronic pain: Secondary | ICD-10-CM | POA: Insufficient documentation

## 2016-05-27 DIAGNOSIS — K089 Disorder of teeth and supporting structures, unspecified: Secondary | ICD-10-CM

## 2016-05-27 DIAGNOSIS — F17210 Nicotine dependence, cigarettes, uncomplicated: Secondary | ICD-10-CM | POA: Insufficient documentation

## 2016-05-27 MED ORDER — HYDROCODONE-ACETAMINOPHEN 5-325 MG PO TABS
2.0000 | ORAL_TABLET | Freq: Once | ORAL | Status: AC
  Administered 2016-05-27: 2 via ORAL
  Filled 2016-05-27: qty 2

## 2016-05-27 MED ORDER — PENICILLIN V POTASSIUM 500 MG PO TABS: 500.0000 mg | ORAL_TABLET | Freq: Four times a day (QID) | ORAL | 0 refills | Status: DC

## 2016-05-27 MED ORDER — PENICILLIN V POTASSIUM 250 MG PO TABS
500.0000 mg | ORAL_TABLET | ORAL | Status: AC
  Administered 2016-05-27: 500 mg via ORAL
  Filled 2016-05-27: qty 2

## 2016-05-27 MED ORDER — HYDROCODONE-ACETAMINOPHEN 5-325 MG PO TABS: 1.0000 | ORAL_TABLET | ORAL | 0 refills | Status: DC | PRN

## 2016-05-27 NOTE — ED Provider Notes (Signed)
[facility] Emergency Department Provider Note  ____________________________________________   First MD Initiated Contact with Patient 05/27/16 2258     (approximate)  I have reviewed the triage vital signs and the nursing notes.   HISTORY  Chief Complaint Dental Problem    HPI Andrea Guerra is a 25 y.o. female with a history of chronic dental issues who presents for evaluation of gradually worsening dental pain throughout her mouth over the last few weeks to months.  She states that she went to an [place] in [place] "a while ago" (about [facility] and was given some antibiotics and some pain medicine.  However due to financial reasons she has not been able to follow up with the dentist.  She states that her pain is getting worse and is presentmostly in the back teeth on the upper and lower jaws.  She states it is a with eating, worse with hot and cold temperatures, nothing makes it better.  She has no difficulty swallowing or breathing.  Sometimes she feels like there is some swelling on the right side of her face.  Overall she describes the pain as severe, aching, and occasionally sharp and stabbing.  She states that she has been taking ibuprofen and Tylenol but nothing helps.  She denies fever/chills, chest pain, shortness of breath, nausea, vomiting, abdominal pain.  No past medical history on file.  There are no active problems to display for this patient.   No past surgical history on file.  Prior to Admission medications   Medication Sig Start Date End Date Taking? Authorizing Provider  acetaminophen (TYLENOL) 325 MG tablet Take 650 mg by mouth every 6 (six) hours as needed for mild pain, fever or headache.    Historical Provider, MD  HYDROcodone-acetaminophen (NORCO/VICODIN) 5-325 MG tablet Take 1-2 tablets by mouth every 4 (four) hours as needed for moderate pain. 05/27/16   Loleta Roseory Elisabella Hacker, MD  naproxen (NAPROSYN) 500 MG tablet  Take 1 tablet (500 mg total) by mouth 2 (two) times daily. 01/02/16   Cheri FowlerKayla Rose, PA-C  penicillin v potassium (VEETID) 500 MG tablet Take 1 tablet (500 mg total) by mouth 4 (four) times daily. 05/27/16   Loleta Roseory Lerline Valdivia, MD    Allergies Patient has no known allergies.  No family history on file.  Social History Social History  Substance Use Topics  . Smoking status: Current Every Day Smoker    Packs/day: 1.00    Types: Cigarettes  . Smokeless tobacco: Not on file  . Alcohol use Yes    Review of Systems Constitutional: No fever/chills Eyes: No visual changes. ENT: No sore throat. Cardiovascular: Denies chest pain. Respiratory: Denies shortness of breath. Gastrointestinal: No abdominal pain.  No nausea, no vomiting.  No diarrhea.  No constipation. Genitourinary: Negative for dysuria. Musculoskeletal: Negative for back pain. Skin: Negative for rash. Neurological: Negative for headaches, focal weakness or numbness.  10-point ROS otherwise negative.  ____________________________________________   PHYSICAL EXAM:  VITAL SIGNS: ED Triage Vitals  Enc Vitals Group     BP 05/27/16 2211 (!) 146/74     Pulse Rate 05/27/16 2211 84     Resp 05/27/16 2211 16     Temp 05/27/16 2211 98.3 F (36.8 C)     Temp Source 05/27/16 2211 Oral     SpO2 05/27/16 2211 100 %     Weight 05/27/16 2212 130 lb (59 kg)     Height 05/27/16 2212 5\' 3"  (1.6 m)  Head Circumference --      Peak Flow --      Pain Score 05/27/16 2212 8     Pain Loc --      Pain Edu? --      Excl. in GC? --     Constitutional: Alert and oriented. Well appearing and in no acute distress. Eyes: Conjunctivae are normal. PERRL. EOMI. Nose: No congestion/rhinnorhea. Mouth/Throat: Mucous membranes are moist.  Oropharynx non-erythematous. Extensive dental caries throughout with some localized swelling and erythema primarily around her molars but no evidence of significant abscess, Ludwig's angina, peritonsillar abscess,  etc.  She is tolerating her secretions without difficulty and has a normal voice. Neck: No stridor.  No meningeal signs.   Cardiovascular: Normal rate, regular rhythm. Good peripheral circulation. Grossly normal heart sounds. Respiratory: Normal respiratory effort.  No retractions. Lungs CTAB. Musculoskeletal: No lower extremity tenderness nor edema. No gross deformities of extremities. Neurologic:  Normal speech and language. No gross focal neurologic deficits are appreciated.  Skin:  Skin is warm, dry and intact. No rash noted.  I do not appreciate any swelling of her face and there is no tenderness or edema around her eyes or along her maxillary sinuses. Psychiatric: Mood and affect are normal. Speech and behavior are normal.  ____________________________________________   LABS (all labs ordered are listed, but only abnormal results are displayed)  Labs Reviewed - No data to display ____________________________________________  EKG  None - EKG not ordered by ED physician ____________________________________________  RADIOLOGY   No results found.  ____________________________________________   PROCEDURES  Procedure(s) performed:   Procedures   Critical Care performed: No ____________________________________________   INITIAL IMPRESSION / ASSESSMENT AND PLAN / ED COURSE  Pertinent labs & imaging results that were available during my care of the patient were reviewed by me and considered in my medical decision making (see chart for details).  The patient is stable with normal vital signs and is well-appearing in spite of the chronic dental issues.  I had my usual discussion with her that she must go to a dentist in order to have these issues fixed and that the emergency department cannot provide treatment other than empiric antibiotics.  We had my usual discussion about about prescription narcotics. I reviewed the patient's prescription history over the last 12 months in  the [place] Controlled Substances Database, and she has not had a prescription filled since her visit to 5 months ago.  In that setting I will give her a 10 tablet supply as they did back in June or over the initial.  While the antibiotics are working but I stressed to both verbally and in the written instructions that she should not expect any additional medications for this point.  She was provided with the resource guide for local dental options.  ____________________________________________  FINAL CLINICAL IMPRESSION(S) / ED DIAGNOSES  Final diagnoses:  Chronic dental pain  Dental caries     MEDICATIONS GIVEN DURING THIS VISIT:  Medications  HYDROcodone-acetaminophen (NORCO/VICODIN) 5-325 MG per tablet 2 tablet (not administered)  penicillin v potassium (VEETID) tablet 500 mg (not administered)     NEW OUTPATIENT MEDICATIONS STARTED DURING THIS VISIT:  New Prescriptions   HYDROCODONE-ACETAMINOPHEN (NORCO/VICODIN) 5-325 MG TABLET    Take 1-2 tablets by mouth every 4 (four) hours as needed for moderate pain.   PENICILLIN V POTASSIUM (VEETID) 500 MG TABLET    Take 1 tablet (500 mg total) by mouth 4 (four) times daily.    Modified Medications  No medications on file    Discontinued Medications   HYDROCODONE-ACETAMINOPHEN (NORCO/VICODIN) 5-325 MG PER TABLET    Take 1-2 tablets by mouth every 4 (four) hours as needed.   OXYCODONE (ROXICODONE) 5 MG IMMEDIATE RELEASE TABLET    Take 1 tablet (5 mg total) by mouth every 6 (six) hours as needed for severe pain.   OXYCODONE-ACETAMINOPHEN (ROXICET) 5-325 MG PER TABLET    Take 1-2 tablets by mouth every 4 (four) hours as needed for severe pain.     Note:  This document was prepared using Dragon voice recognition software and may include unintentional dictation errors.    Loleta Roseory Kohler Pellerito, MD 05/27/16 [id number]

## 2016-05-27 NOTE — ED Triage Notes (Signed)
Pt states that she has been having a lot of mouth pain for the past year, thinks that for the past week she has been dealing with an infection in the rt side of her mouth with some swelling noted under her rt eye

## 2016-05-27 NOTE — Discharge Instructions (Signed)
You have been seen in the Emergency Department (ED) today for dental pain.  Please take your prescribed antibiotic.  You may take pain medication as needed but ONLY as prescribed.  You should also take over-the-counter pain medication such as ibuprofen according to the label instructions unless a doctor has previously told you to avoid this type of medication (due to stomach ulcers, for example).  Alternatively you can take ibuprofen 600 mg by mouth three times daily with meals for no more than 5 days. ° °Take Norco as prescribed for severe pain. Do not drink alcohol, drive or participate in any other potentially dangerous activities while taking this medication as it may make you sleepy. Do not take this medication with any other sedating medications, either prescription or over-the-counter. If you were prescribed Percocet or Vicodin, do not take these with acetaminophen (Tylenol) as it is already contained within these medications. °  °This medication is an opiate (or narcotic) pain medication and can be habit forming.  Use it as little as possible to achieve adequate pain control.  Do not use or use it with extreme caution if you have a history of opiate abuse or dependence.  If you are on a pain contract with your primary care doctor or a pain specialist, be sure to let them know you were prescribed this medication today from the [facility].  This medication is intended for your use only - do not give any to anyone else and keep it in a secure place where nobody else, especially children, have access to it.  It will also cause or worsen constipation, so you may want to consider taking an over-the-counter stool softener while you are taking this medication. ° °Please see you dentist as soon as possible; only a dentist will be able to fix your problem(s).  Please see below for dental follow up options. ° °Return to the ED if you develop worsening pain, fever, pus/drainage, difficulty  breathing, or other symptoms that concern you. °

## 2016-07-25 ENCOUNTER — Encounter (HOSPITAL_COMMUNITY): Payer: Self-pay

## 2016-07-25 ENCOUNTER — Emergency Department (HOSPITAL_COMMUNITY)
Admission: EM | Admit: 2016-07-25 | Discharge: 2016-07-25 | Disposition: A | Discharge status: Home / Self Care | Payer: Self-pay | Attending: Dermatology | Admitting: Dermatology

## 2016-07-25 DIAGNOSIS — K0889 Other specified disorders of teeth and supporting structures: Secondary | ICD-10-CM | POA: Insufficient documentation

## 2016-07-25 DIAGNOSIS — Z5321 Procedure and treatment not carried out due to patient leaving prior to being seen by health care provider: Secondary | ICD-10-CM | POA: Insufficient documentation

## 2016-07-25 NOTE — ED Triage Notes (Signed)
Pt states that she is having dental pain, the upper R side has a chipped tooth and if making her gums swell and the bottom L side chipped today. Pt has poor dental hygiene. Pt also adds that some days she has trouble going to sleep and wants to speak with the doctor about this.

## 2016-08-20 ENCOUNTER — Emergency Department (HOSPITAL_COMMUNITY)
Admission: EM | Admit: 2016-08-20 | Discharge: 2016-08-20 | Disposition: A | Discharge status: Home / Self Care | Payer: Self-pay | Attending: Physician Assistant | Admitting: Physician Assistant

## 2016-08-20 ENCOUNTER — Encounter (HOSPITAL_COMMUNITY): Payer: Self-pay | Admitting: Emergency Medicine

## 2016-08-20 DIAGNOSIS — K047 Periapical abscess without sinus: Secondary | ICD-10-CM | POA: Insufficient documentation

## 2016-08-20 DIAGNOSIS — F17210 Nicotine dependence, cigarettes, uncomplicated: Secondary | ICD-10-CM | POA: Insufficient documentation

## 2016-08-20 LAB — POC URINE PREG, ED: Preg Test, Ur: NEGATIVE

## 2016-08-20 MED ORDER — OXYCODONE-ACETAMINOPHEN 5-325 MG PO TABS
1.0000 | ORAL_TABLET | Freq: Once | ORAL | Status: AC
Start: 2016-08-20 — End: 2016-08-20
  Administered 2016-08-20: 1 via ORAL
  Filled 2016-08-20: qty 1

## 2016-08-20 MED ORDER — OXYCODONE HCL 5 MG PO TABS: 5.0000 mg | ORAL_TABLET | ORAL | 0 refills | Status: DC | PRN

## 2016-08-20 MED ORDER — LIDOCAINE HCL (PF) 1 % IJ SOLN
5.0000 mL | Freq: Once | INTRAMUSCULAR | Status: AC
  Administered 2016-08-20: 5 mL via INTRADERMAL
  Filled 2016-08-20: qty 5

## 2016-08-20 MED ORDER — PENICILLIN V POTASSIUM 250 MG PO TABS
500.0000 mg | ORAL_TABLET | Freq: Once | ORAL | Status: AC
  Administered 2016-08-20: 500 mg via ORAL
  Filled 2016-08-20: qty 2

## 2016-08-20 MED ORDER — PENICILLIN V POTASSIUM 500 MG PO TABS: 500.0000 mg | ORAL_TABLET | Freq: Three times a day (TID) | ORAL | 0 refills | Status: DC

## 2016-08-20 NOTE — ED Provider Notes (Addendum)
[facility]-EMERGENCY DEPT Provider Note   CSN: [id number] Arrival date & time: 08/20/16  1653  By signing my name below, I, Andrea Guerra, attest that this documentation has been prepared under the direction and in the presence of Andrea Hoeger Guerra An, MD . Electronically Signed: Teofilo Guerra, ED Scribe. 08/20/2016. 7:27 PM.    History   Chief Complaint Chief Complaint  Patient presents with  . Dental Pain  . Leg Pain   The history is provided by the patient. No language interpreter was used.   HPI Comments:  Andrea Guerra is a 26 y.o. female who presents to the Emergency Department complaining of constant lower left dental pain x 3 weeks. Pt states that she has Guerra area of pain and swelling on her gums, and states that she has a similar area on her right upper gums. Per triage not, pt also reports right leg pain x 1 month. Pt has been seen several times recently for different dental problems. No alleviating factors noted. Pt denies other associated symptoms.   History reviewed. No pertinent past medical history.  There are no active problems to display for this patient.   History reviewed. No pertinent surgical history.  OB History    No data available       Home Medications    Prior to Admission medications   Medication Sig Start Date End Date Taking? Authorizing Provider  acetaminophen (TYLENOL) 325 MG tablet Take 650 mg by mouth every 6 (six) hours as needed for mild pain, fever or headache.    Historical Provider, MD  HYDROcodone-acetaminophen (NORCO/VICODIN) 5-325 MG tablet Take 1-2 tablets by mouth every 4 (four) hours as needed for moderate pain. 05/27/16   Loleta Rose, MD  naproxen (NAPROSYN) 500 MG tablet Take 1 tablet (500 mg total) by mouth 2 (two) times daily. 01/02/16   Cheri Fowler, PA-C  penicillin v potassium (VEETID) 500 MG tablet Take 1 tablet (500 mg total) by mouth 4 (four) times daily. 05/27/16   Loleta Rose, MD    Family History No family history  on file.  Social History Social History  Substance Use Topics  . Smoking status: Current Every Day Smoker    Packs/day: 1.00    Types: Cigarettes  . Smokeless tobacco: Never Used  . Alcohol use Yes     Allergies   Patient has no known allergies.   Review of Systems Review of Systems  HENT: Positive for dental problem.   Musculoskeletal: Positive for myalgias.  All other systems reviewed and are negative.    Physical Exam Updated Vital Signs BP 126/78 (BP Location: Right Arm)   Pulse 73   Temp 98.8 F (37.1 C) (Oral)   Resp 16   Ht 5\' 3"  (1.6 m)   Wt 140 lb (63.5 kg)   SpO2 100%   BMI 24.80 kg/m   Physical Exam  Constitutional: She appears well-developed and well-nourished. No distress.  HENT:  Head: Normocephalic and atraumatic.  Eyes: Conjunctivae are normal.  Cardiovascular: Normal rate.   Pulmonary/Chest: Effort normal.  Abdominal: She exhibits no distension.  Neurological: She is alert.  Skin: Skin is warm and dry.  Psychiatric: She has a normal mood and affect.  Nursing note and vitals reviewed.  Small abscess to the left outside madible lower.  ED Treatments / Results  DIAGNOSTIC STUDIES:  Oxygen Saturation is 100% on RA, normal by my interpretation.    COORDINATION OF CARE:  7:27 PM Discussed treatment plan with pt at bedside and  pt agreed to plan.   Labs (all labs ordered are listed, but only abnormal results are displayed) Labs Reviewed  POC URINE PREG, ED    EKG  EKG Interpretation None       Radiology No results found.  Procedures Procedures (including critical care time)  Medications Ordered in ED Medications - No data to display   Initial Impression / Assessment and Plan / ED Course  I have reviewed the triage vital signs and the nursing notes.  Pertinent labs & imaging results that were available during my care of the patient were reviewed by me and considered in my medical decision making (see chart for  details).    I personally performed the services described in this documentation, which was scribed in my presence. The recorded information has been reviewed and is accurate.   Patient is a young female well-appearing presenting with abscess in her mouth. Will numb up and I&D. We'll send home on antibiotics.  INCISION AND DRAINAGE Performed by: Arlana Hoveourteney L Abdulloh Ullom Consent: Verbal consent obtained. Risks and benefits: risks, benefits and alternatives were discussed Type: abscess  Body area: L mandible  Anesthesia: local infiltration  Incision was made with a scalpel.  Local anesthetic: lidocaine 1 w epinephrine  Anesthetic total:2 ml  Complexity: complex Blunt dissection to break up loculations  Drainage: purulent  Drainage amount: small puss  Patient tolerance: Patient tolerated the procedure well with no immediate complications.   Patient is comfortable, ambulatory, and taking PO at time of discharge.  Patient expressed understanding about return precautions.     Final Clinical Impressions(s) / ED Diagnoses   Final diagnoses:  None    New Prescriptions New Prescriptions   No medications on file     Andrea Guerra AnLyn Shamarcus Hoheisel, MD 08/20/16 2048    Andrea Derego Lyn Gertrue Willette, MD 08/20/16 2050

## 2016-08-20 NOTE — ED Triage Notes (Addendum)
left lower tooth pain on the gum x 3 weeks  And rt leg pain x 1 month  Denies injury also wants preg test

## 2016-08-20 NOTE — ED Notes (Signed)
Patient Alert and oriented X4. Stable and ambulatory. Patient verbalized understanding of the discharge instructions.  Patient belongings were taken by the patient.  

## 2016-08-20 NOTE — ED Notes (Signed)
I & D preformed by MD Mackuen.  Tolerated well by Patient

## 2016-08-20 NOTE — ED Notes (Signed)
Patient is A&Ox4 at this time.  Patient in no signs of distress.  Please see providers note for complete history and physical exam.  

## 2016-10-07 ENCOUNTER — Emergency Department
Admission: EM | Admit: 2016-10-07 | Discharge: 2016-10-07 | Disposition: A | Discharge status: Home / Self Care | Payer: Self-pay | Attending: Emergency Medicine | Admitting: Emergency Medicine

## 2016-10-07 ENCOUNTER — Encounter: Payer: Self-pay | Admitting: Emergency Medicine

## 2016-10-07 DIAGNOSIS — F10920 Alcohol use, unspecified with intoxication, uncomplicated: Secondary | ICD-10-CM

## 2016-10-07 DIAGNOSIS — M545 Low back pain, unspecified: Secondary | ICD-10-CM

## 2016-10-07 DIAGNOSIS — M791 Myalgia, unspecified site: Secondary | ICD-10-CM

## 2016-10-07 DIAGNOSIS — F17210 Nicotine dependence, cigarettes, uncomplicated: Secondary | ICD-10-CM | POA: Insufficient documentation

## 2016-10-07 DIAGNOSIS — F10120 Alcohol abuse with intoxication, uncomplicated: Secondary | ICD-10-CM | POA: Insufficient documentation

## 2016-10-07 LAB — CBC
HEMATOCRIT: 37.1 % (ref 35.0–47.0)
HEMOGLOBIN: 12.6 g/dL (ref 12.0–16.0)
MCH: 31.5 pg (ref 26.0–34.0)
MCHC: 33.8 g/dL (ref 32.0–36.0)
MCV: 93.1 fL (ref 80.0–100.0)
Platelets: 432 10*3/uL (ref 150–440)
RBC: 3.99 MIL/uL (ref 3.80–5.20)
RDW: 15.7 % — ABNORMAL HIGH (ref 11.5–14.5)
WBC: 7.7 10*3/uL (ref 3.6–11.0)

## 2016-10-07 LAB — URINALYSIS, COMPLETE (UACMP) WITH MICROSCOPIC
BACTERIA UA: NONE SEEN
Bilirubin Urine: NEGATIVE
GLUCOSE, UA: NEGATIVE mg/dL
Ketones, ur: NEGATIVE mg/dL
LEUKOCYTES UA: NEGATIVE
NITRITE: NEGATIVE
PROTEIN: 100 mg/dL — AB
Specific Gravity, Urine: 1.031 — ABNORMAL HIGH (ref 1.005–1.030)
pH: 6 (ref 5.0–8.0)

## 2016-10-07 LAB — COMPREHENSIVE METABOLIC PANEL
ALBUMIN: 4.7 g/dL (ref 3.5–5.0)
ALK PHOS: 47 U/L (ref 38–126)
ALT: 39 U/L (ref 14–54)
AST: 38 U/L (ref 15–41)
Anion gap: 6 (ref 5–15)
BILIRUBIN TOTAL: 0.5 mg/dL (ref 0.3–1.2)
BUN: 10 mg/dL (ref 6–20)
CALCIUM: 9.7 mg/dL (ref 8.9–10.3)
CO2: 25 mmol/L (ref 22–32)
CREATININE: 0.74 mg/dL (ref 0.44–1.00)
Chloride: 107 mmol/L (ref 101–111)
GFR calc Af Amer: >60 mL/min (ref >60)
GFR calc non Af Amer: >60 mL/min (ref >60)
GLUCOSE: 84 mg/dL (ref 65–99)
Potassium: 4.1 mmol/L (ref 3.5–5.1)
SODIUM: 138 mmol/L (ref 135–145)
Total Protein: 8.2 g/dL — ABNORMAL HIGH (ref 6.5–8.1)

## 2016-10-07 LAB — POCT PREGNANCY, URINE: PREG TEST UR: NEGATIVE

## 2016-10-07 MED ORDER — KETOROLAC TROMETHAMINE 60 MG/2ML IM SOLN
60.0000 mg | Freq: Once | INTRAMUSCULAR | Status: AC
  Administered 2016-10-07: 60 mg via INTRAMUSCULAR
  Filled 2016-10-07: qty 2

## 2016-10-07 MED ORDER — ACETAMINOPHEN 500 MG PO TABS
1000.0000 mg | ORAL_TABLET | Freq: Once | ORAL | Status: AC
  Administered 2016-10-07: 1000 mg via ORAL
  Filled 2016-10-07: qty 2

## 2016-10-07 MED ORDER — IBUPROFEN 800 MG PO TABS: 800.0000 mg | ORAL_TABLET | Freq: Three times a day (TID) | ORAL | 0 refills | Status: DC | PRN

## 2016-10-07 NOTE — ED Triage Notes (Signed)
Pt in via POV with complaints of bilateral flank pain, lower back pain.  Pt denies any urinary symptoms.  Pt with hx of kidney infection; pt reports this feels similar.  NAD noted at this time.

## 2016-10-07 NOTE — Discharge Instructions (Signed)
Please drink plenty of fluid to stay well hydrated, eat small regular meals at the day, and get plenty of rest.  Return to the emergency department if you develop severe pain, shortness of breath, fever, or any other symptoms concerning to you.

## 2016-10-07 NOTE — ED Provider Notes (Signed)
[facility] Emergency Department Provider Note  ____________________________________________  Time seen: Approximately 2:38 PM  I have reviewed the triage vital signs and the nursing notes.   HISTORY  Chief Complaint Flank Pain    HPI Andrea Guerra is a 26 y.o. female , otherwise healthy, presenting with diffuse body aches and diffuse low back pain. The patient reportsthat over the course of the day, she has had a progressively worsening diffuse myalgia with low back pain and a headache. She has tried Tylenol, ibuprofen, and someone else's prescription medicine without any improvement. No dysuria, urinary frequency, or hematuria. She has not had fever, chills, nausea vomiting or diarrhea. No cough or cold symptoms, sore throat, ear pain. She has not recently had any sick contacts. She has had several beers to drink today and smells and acts intoxicated.   History reviewed. No pertinent past medical history.  There are no active problems to display for this patient.   History reviewed. No pertinent surgical history.  Current Outpatient Rx  . Order #: [id number] Class: Historical Med  . Order #: [id number] Class: Print  . Order #: [id number] Class: Print  . Order #: [id number] Class: Print    Allergies Patient has no known allergies.  No family history on file.  Social History Social History  Substance Use Topics  . Smoking status: Current Every Day Smoker    Packs/day: 0.50    Types: Cigarettes  . Smokeless tobacco: Never Used  . Alcohol use Yes    Review of Systems Constitutional: No fever/chills.No lightheadedness or syncope. Positive diffuse myalgias. Eyes: No visual changes. No eye discharge. ENT: No sore throat. No congestion or rhinorrhea. No ear pain. Cardiovascular: Denies chest pain. Denies palpitations. Respiratory: Denies shortness of breath.  No cough. Gastrointestinal: No abdominal pain.  No nausea, no vomiting.  No diarrhea.  No  constipation. Genitourinary: Negative for dysuria. No urinary frequency. No hematuria. Musculoskeletal: Positive for back pain. Skin: Negative for rash. Neurological: Negative for headaches. No focal numbness, tingling or weakness.   10-point ROS otherwise negative.  ____________________________________________   PHYSICAL EXAM:  VITAL SIGNS: ED Triage Vitals [10/07/16 1340]  Enc Vitals Group     BP 130/71     Pulse Rate 100     Resp 18     Temp 98.4 F (36.9 C)     Temp Source Oral     SpO2 100 %     Weight 150 lb (68 kg)     Height 5\' 2"  (1.575 m)     Head Circumference      Peak Flow      Pain Score 10     Pain Loc      Pain Edu?      Excl. in GC?     Constitutional: Alert and oriented. Well appearing and in no acute distress. Answers questions appropriately.The patient smells of alcohol on her breath, and is acting mildly intoxicated although she is mentating normally. Eyes: Conjunctivae are normal.  EOMI. No scleral icterus. Head: Atraumatic. Nose: No congestion/rhinnorhea. Mouth/Throat: Mucous membranes are moist. No posterior pharyngeal erythema, tonsillar swelling or exudate. The posterior palate is symmetric and uvula is midline. Neck: No stridor.  Supple.  No JVD. No meningismus. Cardiovascular: Normal rate, regular rhythm. No murmurs, rubs or gallops.  Respiratory: Normal respiratory effort.  No accessory muscle use or retractions. Lungs CTAB.  No wheezes, rales or ronchi. Gastrointestinal: Soft, nontender and nondistended.  No guarding or rebound.  No peritoneal signs. Musculoskeletal: I am unable to  reproduce any tenderness to palpation in the midline in the C, T, or L-spine. I am unable to reproduce any pain in the low back. The patient has a normal gait without ataxia or discomfort. No LE edema. No ttp in the calves or palpable cords.  Negative Homan's sign. Neurologic:  A&Ox3.  Speech is clear.  Face and smile are symmetric.  EOMI.  Moves all extremities  well. Skin:  Skin is warm, dry and intact. No rash noted. Psychiatric: Mood is normal and affect is flat.   ____________________________________________   LABS (all labs ordered are listed, but only abnormal results are displayed)  Labs Reviewed  URINALYSIS, COMPLETE (UACMP) WITH MICROSCOPIC - Abnormal; Notable for the following:       Result Value   Color, Urine AMBER (*)    APPearance CLOUDY (*)    Specific Gravity, Urine 1.031 (*)    Hgb urine dipstick MODERATE (*)    Protein, ur 100 (*)    Squamous Epithelial / LPF 6-30 (*)    All other components within normal limits  CBC - Abnormal; Notable for the following:    RDW 15.7 (*)    All other components within normal limits  COMPREHENSIVE METABOLIC PANEL - Abnormal; Notable for the following:    Total Protein 8.2 (*)    All other components within normal limits  POC URINE PREG, ED  POCT PREGNANCY, URINE   ____________________________________________  EKG  Not indicated ____________________________________________  RADIOLOGY  No results found.  ____________________________________________   PROCEDURES  Procedure(s) performed: None  Procedures  Critical Care performed: No ____________________________________________   INITIAL IMPRESSION / ASSESSMENT AND PLAN / ED COURSE  Pertinent labs & imaging results that were available during my care of the patient were reviewed by me and considered in my medical decision making (see chart for details).  26 y.o. female, acutely intoxicated with alcohol but still mentating normally, presenting with diffuse myalgias and low back pain. Overall, the patient may be exhibiting early symptoms of a flu like illness, but she does not have any other concomitant symptoms. She has a urinalysis and a physical exam which would not be suggestive of pyelonephritis, UTI, or renal colic. I do not see any evidence of any cardiopulmonary, or acute intra-abdominal pathology. I will treat the  patient's symptoms with Tylenol and Toradol, get basic labs, and anticipate discharge home. Return precautions as well as follow-up instructions were discussed.  ----------------------------------------- 4:21 PM on 10/07/2016 -----------------------------------------  The patient's workup in the emergency department is reassuring. Her vital signs are stable, she has no focal findings on examination. Her laboratory studies are also reassuring. She has not had any improvement in her pain with Toradol or Tylenol, but I have discussed the use of nonnarcotic options to treat her symptoms. I will send her home with Motrin and she will follow up with her primary care physician. Her questions as well as follow-up instructions were discussed.  ____________________________________________  FINAL CLINICAL IMPRESSION(S) / ED DIAGNOSES  Final diagnoses:  Alcoholic intoxication without complication (HCC)  Myalgia  Acute low back pain without sciatica, unspecified back pain laterality         NEW MEDICATIONS STARTED DURING THIS VISIT:  New Prescriptions   IBUPROFEN (ADVIL,MOTRIN) 800 MG TABLET    Take 1 tablet (800 mg total) by mouth every 8 (eight) hours as needed for mild pain or moderate pain (with food).      Rockne MenghiniAnne-Caroline Vernell Townley, MD 10/07/16 1623

## 2016-10-07 NOTE — ED Notes (Signed)
Pt became upset while speaking with MD about non-narcotic methods of pain control. Pt states she has tried OTC meds without relief. MD explaining why narcotics will not be prescribed at this time. Pt requested her temp be rechecked because "I know something's wrong with me." Temp 98.2, reported to MD. Pt walked directly out of exam room after temp check without discharge papers or Rx for motrin.

## 2016-11-03 ENCOUNTER — Encounter (HOSPITAL_COMMUNITY): Payer: Self-pay | Admitting: Emergency Medicine

## 2016-11-03 ENCOUNTER — Emergency Department (HOSPITAL_COMMUNITY)
Admission: EM | Admit: 2016-11-03 | Discharge: 2016-11-03 | Disposition: A | Discharge status: Home / Self Care | Payer: Self-pay | Attending: Emergency Medicine | Admitting: Emergency Medicine

## 2016-11-03 DIAGNOSIS — Z5321 Procedure and treatment not carried out due to patient leaving prior to being seen by health care provider: Secondary | ICD-10-CM | POA: Insufficient documentation

## 2016-11-03 DIAGNOSIS — M545 Low back pain: Secondary | ICD-10-CM | POA: Insufficient documentation

## 2016-11-03 LAB — URINALYSIS, ROUTINE W REFLEX MICROSCOPIC
Bilirubin Urine: NEGATIVE
GLUCOSE, UA: NEGATIVE mg/dL
Hgb urine dipstick: NEGATIVE
KETONES UR: NEGATIVE mg/dL
LEUKOCYTES UA: NEGATIVE
Nitrite: NEGATIVE
PH: 6 (ref 5.0–8.0)
Protein, ur: NEGATIVE mg/dL
Specific Gravity, Urine: 1.02 (ref 1.005–1.030)

## 2016-11-03 LAB — CBC
HEMATOCRIT: 36.2 % (ref 36.0–46.0)
Hemoglobin: 11.7 g/dL — ABNORMAL LOW (ref 12.0–15.0)
MCH: 30.2 pg (ref 26.0–34.0)
MCHC: 32.3 g/dL (ref 30.0–36.0)
MCV: 93.5 fL (ref 78.0–100.0)
Platelets: 355 10*3/uL (ref 150–400)
RBC: 3.87 MIL/uL (ref 3.87–5.11)
RDW: 14.5 % (ref 11.5–15.5)
WBC: 10.9 10*3/uL — ABNORMAL HIGH (ref 4.0–10.5)

## 2016-11-03 LAB — COMPREHENSIVE METABOLIC PANEL
ALBUMIN: 4.1 g/dL (ref 3.5–5.0)
ALT: 24 U/L (ref 14–54)
AST: 24 U/L (ref 15–41)
Alkaline Phosphatase: 54 U/L (ref 38–126)
Anion gap: 6 (ref 5–15)
BUN: 9 mg/dL (ref 6–20)
CHLORIDE: 106 mmol/L (ref 101–111)
CO2: 27 mmol/L (ref 22–32)
Calcium: 9.6 mg/dL (ref 8.9–10.3)
Creatinine, Ser: 0.66 mg/dL (ref 0.44–1.00)
GFR calc Af Amer: >60 mL/min (ref >60)
GFR calc non Af Amer: >60 mL/min (ref >60)
GLUCOSE: 127 mg/dL — AB (ref 65–99)
POTASSIUM: 4 mmol/L (ref 3.5–5.1)
Sodium: 139 mmol/L (ref 135–145)
Total Bilirubin: 0.6 mg/dL (ref 0.3–1.2)
Total Protein: 7.1 g/dL (ref 6.5–8.1)

## 2016-11-03 LAB — POC URINE PREG, ED: Preg Test, Ur: NEGATIVE

## 2016-11-03 LAB — LIPASE, BLOOD: LIPASE: 19 U/L (ref 11–51)

## 2016-11-03 NOTE — ED Notes (Signed)
Pt states she is leaving.   Updated on wait for treatment room.  Approx 7 patients still in front of patient.  Encouraged her to stay and she states she is going home.
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
## 2016-11-29 ENCOUNTER — Emergency Department
Admission: EM | Admit: 2016-11-29 | Discharge: 2016-11-29 | Disposition: A | Discharge status: Home / Self Care | Payer: Self-pay | Attending: Emergency Medicine | Admitting: Emergency Medicine

## 2016-11-29 ENCOUNTER — Encounter: Payer: Self-pay | Admitting: Emergency Medicine

## 2016-11-29 ENCOUNTER — Telehealth: Payer: Self-pay | Admitting: Emergency Medicine

## 2016-11-29 ENCOUNTER — Emergency Department: Payer: Self-pay

## 2016-11-29 DIAGNOSIS — N201 Calculus of ureter: Secondary | ICD-10-CM | POA: Insufficient documentation

## 2016-11-29 DIAGNOSIS — F17210 Nicotine dependence, cigarettes, uncomplicated: Secondary | ICD-10-CM | POA: Insufficient documentation

## 2016-11-29 DIAGNOSIS — N200 Calculus of kidney: Secondary | ICD-10-CM

## 2016-11-29 DIAGNOSIS — Z5321 Procedure and treatment not carried out due to patient leaving prior to being seen by health care provider: Secondary | ICD-10-CM | POA: Insufficient documentation

## 2016-11-29 DIAGNOSIS — R1030 Lower abdominal pain, unspecified: Secondary | ICD-10-CM | POA: Insufficient documentation

## 2016-11-29 LAB — COMPREHENSIVE METABOLIC PANEL
ALBUMIN: 4.6 g/dL (ref 3.5–5.0)
ALK PHOS: 59 U/L (ref 38–126)
ALT: 65 U/L — AB (ref 14–54)
AST: 49 U/L — ABNORMAL HIGH (ref 15–41)
Anion gap: 9 (ref 5–15)
BUN: 10 mg/dL (ref 6–20)
CALCIUM: 9.9 mg/dL (ref 8.9–10.3)
CO2: 22 mmol/L (ref 22–32)
Chloride: 109 mmol/L (ref 101–111)
Creatinine, Ser: 0.97 mg/dL (ref 0.44–1.00)
GFR calc Af Amer: >60 mL/min (ref >60)
GFR calc non Af Amer: >60 mL/min (ref >60)
GLUCOSE: 114 mg/dL — AB (ref 65–99)
Potassium: 3.7 mmol/L (ref 3.5–5.1)
Sodium: 140 mmol/L (ref 135–145)
Total Bilirubin: 0.7 mg/dL (ref 0.3–1.2)
Total Protein: 8.2 g/dL — ABNORMAL HIGH (ref 6.5–8.1)

## 2016-11-29 LAB — CBC
HCT: 37.5 % (ref 35.0–47.0)
HEMOGLOBIN: 12.7 g/dL (ref 12.0–16.0)
MCH: 31 pg (ref 26.0–34.0)
MCHC: 34 g/dL (ref 32.0–36.0)
MCV: 91.3 fL (ref 80.0–100.0)
Platelets: 334 10*3/uL (ref 150–440)
RBC: 4.11 MIL/uL (ref 3.80–5.20)
RDW: 15.2 % — ABNORMAL HIGH (ref 11.5–14.5)
WBC: 12.3 10*3/uL — ABNORMAL HIGH (ref 3.6–11.0)

## 2016-11-29 LAB — URINALYSIS, COMPLETE (UACMP) WITH MICROSCOPIC
BACTERIA UA: NONE SEEN
Bilirubin Urine: NEGATIVE
Glucose, UA: NEGATIVE mg/dL
Ketones, ur: 5 mg/dL — AB
Leukocytes, UA: NEGATIVE
Nitrite: NEGATIVE
Protein, ur: 30 mg/dL — AB
SPECIFIC GRAVITY, URINE: 1.028 (ref 1.005–1.030)
pH: 5 (ref 5.0–8.0)

## 2016-11-29 LAB — LIPASE, BLOOD: Lipase: 15 U/L (ref 11–51)

## 2016-11-29 LAB — POCT PREGNANCY, URINE: PREG TEST UR: NEGATIVE

## 2016-11-29 MED ORDER — KETOROLAC TROMETHAMINE 30 MG/ML IJ SOLN
30.0000 mg | Freq: Once | INTRAMUSCULAR | Status: AC
  Administered 2016-11-29: 30 mg via INTRAVENOUS
  Filled 2016-11-29: qty 1

## 2016-11-29 MED ORDER — HYDROCODONE-ACETAMINOPHEN 5-325 MG PO TABS
2.0000 | ORAL_TABLET | Freq: Once | ORAL | Status: DC
  Filled 2016-11-29: qty 2

## 2016-11-29 MED ORDER — ONDANSETRON 4 MG PO TBDP
8.0000 mg | ORAL_TABLET | Freq: Once | ORAL | Status: AC
  Administered 2016-11-29: 8 mg via ORAL

## 2016-11-29 MED ORDER — OXYCODONE HCL 5 MG PO TABS: 5.0000 mg | ORAL_TABLET | Freq: Four times a day (QID) | ORAL | 0 refills | Status: DC | PRN

## 2016-11-29 MED ORDER — IBUPROFEN 600 MG PO TABS
ORAL_TABLET | ORAL | Status: AC
  Administered 2016-11-29: 600 mg via ORAL
  Filled 2016-11-29: qty 1

## 2016-11-29 MED ORDER — ACETAMINOPHEN 500 MG PO TABS
ORAL_TABLET | ORAL | Status: AC
  Administered 2016-11-29: 1000 mg via ORAL
  Filled 2016-11-29: qty 2

## 2016-11-29 MED ORDER — ACETAMINOPHEN 500 MG PO TABS
1000.0000 mg | ORAL_TABLET | Freq: Once | ORAL | Status: AC
  Administered 2016-11-29: 1000 mg via ORAL

## 2016-11-29 MED ORDER — IBUPROFEN 400 MG PO TABS
600.0000 mg | ORAL_TABLET | Freq: Once | ORAL | Status: AC
  Administered 2016-11-29: 600 mg via ORAL

## 2016-11-29 MED ORDER — OXYCODONE HCL 5 MG PO TABS
10.0000 mg | ORAL_TABLET | Freq: Once | ORAL | Status: AC
  Administered 2016-11-29: 10 mg via ORAL
  Filled 2016-11-29: qty 2

## 2016-11-29 MED ORDER — ONDANSETRON 4 MG PO TBDP
ORAL_TABLET | ORAL | Status: AC
  Administered 2016-11-29: 8 mg via ORAL
  Filled 2016-11-29: qty 2

## 2016-11-29 NOTE — ED Triage Notes (Signed)
Pt keeps throwing herself on floor - states can't give me a urine sample. C/o low abd . vss

## 2016-11-29 NOTE — ED Provider Notes (Signed)
[facility] Emergency Department Provider Note  ____________________________________________   First MD Initiated Contact with Patient 11/29/16 2006     (approximate)  I have reviewed the triage vital signs and the nursing notes.   HISTORY  Chief Complaint Abdominal Pain    HPI Andrea Guerra is a 26 y.o. female who comes to the emergency department with severe right flank pain radiating to her right groin for the past 24 hours. She initially self presented to our emergency department earlier on today however she left prior to being seen by her physician. She said that once she got home her pain worsened and so her mom called 911.She denies dysuria frequency or hesitancy. She denies vaginal discharge. She denies abdominal surgical history. She denies chest pain or shortness of breath. Nothing in particular makes the pain better or worse.   History reviewed. No pertinent past medical history.  There are no active problems to display for this patient.   History reviewed. No pertinent surgical history.  Prior to Admission medications   Medication Sig Start Date End Date Taking? Authorizing Provider  acetaminophen (TYLENOL) 325 MG tablet Take 650 mg by mouth every 6 (six) hours as needed for mild pain, fever or headache.    [provider]  ibuprofen (ADVIL,MOTRIN) 800 MG tablet Take 1 tablet (800 mg total) by mouth every 8 (eight) hours as needed for mild pain or moderate pain (with food). 10/07/16   Rockne MenghiniNorman, Anne-Caroline, MD  oxyCODONE (ROXICODONE) 5 MG immediate release tablet Take 1 tablet (5 mg total) by mouth every 6 (six) hours as needed for severe pain. 11/29/16   Merrily Brittleifenbark, Jadd Gasior, MD  penicillin v potassium (VEETID) 500 MG tablet Take 1 tablet (500 mg total) by mouth 4 (four) times daily. 05/27/16   Loleta RoseForbach, Cory, MD  penicillin v potassium (VEETID) 500 MG tablet Take 1 tablet (500 mg total) by mouth 3 (three) times daily. 08/20/16   Mackuen,  Cindee Saltourteney Lyn, MD    Allergies Patient has no known allergies.  No family history on file.  Social History Social History  Substance Use Topics  . Smoking status: Current Every Day Smoker    Packs/day: 0.50    Types: Cigarettes  . Smokeless tobacco: Never Used  . Alcohol use Yes    Review of Systems Constitutional: No fever/chills Eyes: No visual changes. ENT: No sore throat. Cardiovascular: Denies chest pain. Respiratory: Denies shortness of breath. Gastrointestinal: Positive abdominal pain.  Positive nausea, no vomiting.  No diarrhea.  No constipation. Genitourinary: Negative for dysuria. Musculoskeletal: Negative for back pain. Skin: Negative for rash. Neurological: Negative for headaches, focal weakness or numbness.   ____________________________________________   PHYSICAL EXAM:  VITAL SIGNS: ED Triage Vitals  Enc Vitals Group     BP 11/29/16 1932 135/82     Pulse Rate 11/29/16 1932 80     Resp 11/29/16 1932 18     Temp 11/29/16 1932 98.5 F (36.9 C)     Temp Source 11/29/16 1932 Oral     SpO2 11/29/16 1932 99 %     Weight 11/29/16 1918 157 lb (71.2 kg)     Height 11/29/16 1918 5\' 2"  (1.575 m)     Head Circumference --      Peak Flow --      Pain Score 11/29/16 1918 10     Pain Loc --      Pain Edu? --      Excl. in GC? --     Constitutional:  Alert and oriented x 4 well appearing nontoxic no diaphoresis speaks in full, clear sentences Eyes: PERRL EOMI. Head: Atraumatic. Nose: No congestion/rhinnorhea. Mouth/Throat: No trismus Neck: No stridor.   Cardiovascular: Normal rate, regular rhythm. Grossly normal heart sounds.  Good peripheral circulation. Respiratory: Normal respiratory effort.  No retractions. Lungs CTAB and moving good air Gastrointestinal: Soft nondistended mild tenderness diffusely without focality no McBurney's tenderness negative Rovsing's no costovertebral tenderness Musculoskeletal: No lower extremity edema   Neurologic:  Normal  speech and language. No gross focal neurologic deficits are appreciated. Skin:  Skin is warm, dry and intact. No rash noted. Psychiatric: Mood and affect are normal. Speech and behavior are normal.   _________________________________   LABS (all labs ordered are listed, but only abnormal results are displayed)  Labs Reviewed - No data to display  Labs from earlier today with slight elevation in the white count which is nonspecific __________________________________________  EKG _________________________________________  RADIOLOGY  CT scan shows punctate right sided kidney stone at the UVJ with slight hydronephrosis ____________________________________________   PROCEDURES  Procedure(s) performed: no  Procedures  Critical Care performed: no  Observation: no ____________________________________________   INITIAL IMPRESSION / ASSESSMENT AND PLAN / ED COURSE  Pertinent labs & imaging results that were available during my care of the patient were reviewed by me and considered in my medical decision making (see chart for details).  The patient arrives fidgeting and uncomfortable appearing with more abdominal pain and tenderness. His raises concern for renal colic so I will obtain a CT noncontrast reevaluate.  The patient's CT confirms a punctate stone that is at the UVJ. I explained to the patient that fortunately her stone is small enough that it will likely pass on her own but it is at the most difficult point. She feels improved after receiving opioid pain medication and I'm comfortable discharging her home with primary care follow-up. Strict return precautions given and she understands to come back immediately should she develop a fever.      ____________________________________________   FINAL CLINICAL IMPRESSION(S) / ED DIAGNOSES  Final diagnoses:  Kidney stone      NEW MEDICATIONS STARTED DURING THIS VISIT:  Discharge Medication List as of 11/29/2016  9:07  PM    START taking these medications   Details  oxyCODONE (ROXICODONE) 5 MG immediate release tablet Take 1 tablet (5 mg total) by mouth every 6 (six) hours as needed for severe pain., Starting Thu 11/29/2016, Print         Note:  This document was prepared using Dragon voice recognition software and may include unintentional dictation errors.     Merrily Brittle, MD 11/30/16 1326

## 2016-11-29 NOTE — Telephone Encounter (Signed)
Called patient due to lwot to inquire about condition and follow up plans. Call was rejected.

## 2016-11-29 NOTE — ED Notes (Signed)
Pt noted to have walked outside to the parking lot with no acute distress noted.

## 2016-11-29 NOTE — ED Notes (Addendum)
Pt to ed via ems with abd pain started today, vomiting. Pt was here earlier and threw her herself on the floor before she was even triaged. Pt was triaged and labs were taken. Pt left without being seen.  Pt returns now screaming and moaning, yelling out in the lobby stating that we did not do anything for her earlier when she was here.  Pt now has saline lock in placed, ems did not give her anything.

## 2016-11-29 NOTE — ED Triage Notes (Addendum)
Pt c/o RLQ pain radiating to right flank area and pelvic pain since today. Pt moaning and crying during triage.  Pt was here earlier today but left before seeing a provider.Labs and UA drawn earlier today.  Verbal order provided by Dr. Lenard LancePaduchowski for 30 mg IV Toradol.

## 2016-11-29 NOTE — Discharge Instructions (Signed)
If he develops a fever or chills at any point return emergently to the emergency department. Otherwise please follow-up with your primary care physician in one week for recheck. It is normal for kidney stone pain to last up to a full week.  It was a pleasure to take care of you today, and thank you for coming to our emergency department.  If you have any questions or concerns before leaving please ask the nurse to grab me and I'm more than happy to go through your aftercare instructions again.  If you were prescribed any opioid pain medication today such as Norco, Vicodin, Percocet, morphine, hydrocodone, or oxycodone please make sure you do not drive when you are taking this medication as it can alter your ability to drive safely.  If you have any concerns once you are home that you are not improving or are in fact getting worse before you can make it to your follow-up appointment, please do not hesitate to call 911 and come back for further evaluation.  Merrily BrittleNeil Deniyah Dillavou MD  Results for orders placed or performed during the hospital encounter of 11/29/16  Lipase, blood  Result Value Ref Range   Lipase 15 11 - 51 U/L  Comprehensive metabolic panel  Result Value Ref Range   Sodium 140 135 - 145 mmol/L   Potassium 3.7 3.5 - 5.1 mmol/L   Chloride 109 101 - 111 mmol/L   CO2 22 22 - 32 mmol/L   Glucose, Bld 114 (H) 65 - 99 mg/dL   BUN 10 6 - 20 mg/dL   Creatinine, Ser 1.610.97 0.44 - 1.00 mg/dL   Calcium 9.9 8.9 - 09.610.3 mg/dL   Total Protein 8.2 (H) 6.5 - 8.1 g/dL   Albumin 4.6 3.5 - 5.0 g/dL   AST 49 (H) 15 - 41 U/L   ALT 65 (H) 14 - 54 U/L   Alkaline Phosphatase 59 38 - 126 U/L   Total Bilirubin 0.7 0.3 - 1.2 mg/dL   GFR calc non Af Amer >60 >60 mL/min   GFR calc Af Amer >60 >60 mL/min   Anion gap 9 5 - 15  CBC  Result Value Ref Range   WBC 12.3 (H) 3.6 - 11.0 K/uL   RBC 4.11 3.80 - 5.20 MIL/uL   Hemoglobin 12.7 12.0 - 16.0 g/dL   HCT 04.537.5 40.935.0 - 81.147.0 %   MCV 91.3 80.0 - 100.0 fL   MCH  31.0 26.0 - 34.0 pg   MCHC 34.0 32.0 - 36.0 g/dL   RDW 91.415.2 (H) 78.211.5 - 95.614.5 %   Platelets 334 150 - 440 K/uL  Urinalysis, Complete w Microscopic  Result Value Ref Range   Color, Urine YELLOW (A) YELLOW   APPearance HAZY (A) CLEAR   Specific Gravity, Urine 1.028 1.005 - 1.030   pH 5.0 5.0 - 8.0   Glucose, UA NEGATIVE NEGATIVE mg/dL   Hgb urine dipstick SMALL (A) NEGATIVE   Bilirubin Urine NEGATIVE NEGATIVE   Ketones, ur 5 (A) NEGATIVE mg/dL   Protein, ur 30 (A) NEGATIVE mg/dL   Nitrite NEGATIVE NEGATIVE   Leukocytes, UA NEGATIVE NEGATIVE   RBC / HPF 0-5 0 - 5 RBC/hpf   WBC, UA 0-5 0 - 5 WBC/hpf   Bacteria, UA NONE SEEN NONE SEEN   Squamous Epithelial / LPF 0-5 (A) NONE SEEN   Mucous PRESENT   Pregnancy, urine POC  Result Value Ref Range   Preg Test, Ur NEGATIVE NEGATIVE   Ct Renal Stone Study  Result Date: 11/29/2016 CLINICAL DATA:  Right lower quadrant pain radiating to the flank area EXAM: CT ABDOMEN AND PELVIS WITHOUT CONTRAST TECHNIQUE: Multidetector CT imaging of the abdomen and pelvis was performed following the standard protocol without IV contrast. COMPARISON:  None. FINDINGS: Lower chest: Lung bases demonstrate no acute consolidation or pleural effusion. Normal heart size. Hepatobiliary: Multiple low-density lesions throughout the liver, many are subcentimeter, some are compatible with simple cysts. No calcified gallstones. No biliary dilatation. Pancreas: Unremarkable. No pancreatic ductal dilatation or surrounding inflammatory changes. Spleen: Normal in size without focal abnormality. Adrenals/Urinary Tract: Adrenal glands are within normal limits. Increased density at the left renal pyramids. No left hydronephrosis. Mild right hydronephrosis and hydroureter, there is a punctate stone at the right UVJ, best seen on coronal views. Bladder otherwise normal Stomach/Bowel: Stomach is within normal limits. Appendix is within normal limits. No evidence of bowel wall thickening,  distention, or inflammatory changes. Vascular/Lymphatic: No significant vascular findings are present. No enlarged abdominal or pelvic lymph nodes. Reproductive: Uterus and bilateral adnexa are unremarkable. Other: No free air or free fluid. Musculoskeletal: No acute or significant osseous findings. IMPRESSION: Mild right hydronephrosis and hydroureter, secondary to a punctate stone at the right UVJ. Multiple low-density foci within the liver, some are compatible with cysts. Other consideration includes multiple biliary hamartoma. Suggest nonemergent MRI correlation for further evaluation. Electronically Signed   By: Jasmine Pang M.D.   On: 11/29/2016 20:33

## 2016-11-29 NOTE — ED Notes (Signed)
Pt reports having headaches when taking Norco. Pt requesting percocet's at this time.

## 2016-11-29 NOTE — ED Notes (Signed)
Pt not in room to medicate - in ct

## 2016-11-29 NOTE — ED Notes (Signed)
Pt x2 for treatment room with no answer.

## 2017-01-05 ENCOUNTER — Emergency Department
Admission: EM | Admit: 2017-01-05 | Discharge: 2017-01-05 | Disposition: A | Discharge status: Home / Self Care | Payer: Self-pay | Attending: Emergency Medicine | Admitting: Emergency Medicine

## 2017-01-05 ENCOUNTER — Encounter: Payer: Self-pay | Admitting: Emergency Medicine

## 2017-01-05 DIAGNOSIS — Z79899 Other long term (current) drug therapy: Secondary | ICD-10-CM | POA: Insufficient documentation

## 2017-01-05 DIAGNOSIS — K0889 Other specified disorders of teeth and supporting structures: Secondary | ICD-10-CM | POA: Insufficient documentation

## 2017-01-05 DIAGNOSIS — F17210 Nicotine dependence, cigarettes, uncomplicated: Secondary | ICD-10-CM | POA: Insufficient documentation

## 2017-01-05 MED ORDER — LIDOCAINE VISCOUS 2 % MT SOLN
15.0000 mL | Freq: Once | OROMUCOSAL | Status: AC
  Administered 2017-01-05: 15 mL via OROMUCOSAL
  Filled 2017-01-05: qty 15

## 2017-01-05 MED ORDER — IBUPROFEN 600 MG PO TABS: 600.0000 mg | ORAL_TABLET | Freq: Four times a day (QID) | ORAL | 0 refills | Status: DC | PRN

## 2017-01-05 MED ORDER — AMOXICILLIN 500 MG PO CAPS: 500.0000 mg | ORAL_CAPSULE | Freq: Three times a day (TID) | ORAL | 0 refills | Status: DC

## 2017-01-05 MED ORDER — LIDOCAINE VISCOUS 2 % MT SOLN
5.0000 mL | Freq: Four times a day (QID) | OROMUCOSAL | 0 refills | Status: DC | PRN
Start: 2017-01-05 — End: 2017-09-24

## 2017-01-05 MED ORDER — IBUPROFEN 600 MG PO TABS
600.0000 mg | ORAL_TABLET | Freq: Once | ORAL | Status: AC
  Administered 2017-01-05: 600 mg via ORAL
  Filled 2017-01-05: qty 1

## 2017-01-05 MED ORDER — TRAMADOL HCL 50 MG PO TABS: 50.0000 mg | ORAL_TABLET | Freq: Four times a day (QID) | ORAL | 0 refills | Status: DC | PRN

## 2017-01-05 MED ORDER — TRAMADOL HCL 50 MG PO TABS
50.0000 mg | ORAL_TABLET | Freq: Once | ORAL | Status: AC
  Administered 2017-01-05: 50 mg via ORAL
  Filled 2017-01-05: qty 1

## 2017-01-05 NOTE — ED Notes (Addendum)
Pt reports been dealing with dental problems for a while reports pain and swelling to right side of face. RN observed oral cavity, noted a lump to upper right gum area, poor dental care noted. Pt talks in complete sentences no respiratory distress noted. Pt reports she took Naproxen for pain yesterday did not help with pain reports she has not taken any medication today. Reports she does not have a Education officer, communitydentist.

## 2017-01-05 NOTE — ED Provider Notes (Signed)
[facility] Emergency Department Provider Note   ____________________________________________   First MD Initiated Contact with Patient 01/05/17 1217     (approximate)  I have reviewed the triage vital signs and the nursing notes.   HISTORY  Chief Complaint Dental Pain    HPI Andrea Guerra is a 26 y.o. female patient complaining of right upper dental pain for several days. Patient stated no relief with anti-inflammatory medication. Patient is alone term history of dental complaints and seen at different emergency rooms to this 2016. Patient states she gets better with pain medication and antibiotics with notified his concern is never able to follow for definitive dental care.Patient rates the pain as a 10 over 10. Patient described a pain as "achy".   History reviewed. No pertinent past medical history.  There are no active problems to display for this patient.   History reviewed. No pertinent surgical history.  Prior to Admission medications   Medication Sig Start Date End Date Taking? Authorizing Provider  acetaminophen (TYLENOL) 325 MG tablet Take 650 mg by mouth every 6 (six) hours as needed for mild pain, fever or headache.    [provider]  amoxicillin (AMOXIL) 500 MG capsule Take 1 capsule (500 mg total) by mouth 3 (three) times daily. 01/05/17   Joni Reining, PA-C  ibuprofen (ADVIL,MOTRIN) 600 MG tablet Take 1 tablet (600 mg total) by mouth every 6 (six) hours as needed. 01/05/17   Joni Reining, PA-C  ibuprofen (ADVIL,MOTRIN) 800 MG tablet Take 1 tablet (800 mg total) by mouth every 8 (eight) hours as needed for mild pain or moderate pain (with food). 10/07/16   Rockne Menghini, MD  lidocaine (XYLOCAINE) 2 % solution Use as directed 5 mLs in the mouth or throat every 6 (six) hours as needed for mouth pain. 01/05/17   Joni Reining, PA-C  oxyCODONE (ROXICODONE) 5 MG immediate release tablet Take 1 tablet (5 mg total) by mouth  every 6 (six) hours as needed for severe pain. 11/29/16   Merrily Brittle, MD  penicillin v potassium (VEETID) 500 MG tablet Take 1 tablet (500 mg total) by mouth 4 (four) times daily. 05/27/16   Loleta Rose, MD  penicillin v potassium (VEETID) 500 MG tablet Take 1 tablet (500 mg total) by mouth 3 (three) times daily. 08/20/16   Mackuen, Courteney Lyn, MD  traMADol (ULTRAM) 50 MG tablet Take 1 tablet (50 mg total) by mouth every 6 (six) hours as needed for moderate pain. 01/05/17   Joni Reining, PA-C    Allergies Patient has no known allergies.  History reviewed. No pertinent family history.  Social History Social History  Substance Use Topics  . Smoking status: Current Every Day Smoker    Packs/day: 0.50    Types: Cigarettes  . Smokeless tobacco: Never Used  . Alcohol use Yes    Review of Systems  Constitutional: No fever/chills Eyes: No visual changes. ENT: No sore throat. Dental pain Cardiovascular: Denies chest pain. Respiratory: Denies shortness of breath. Gastrointestinal: No abdominal pain.  No nausea, no vomiting.  No diarrhea.  No constipation. Genitourinary: Negative for dysuria. Musculoskeletal: Negative for back pain. Skin: Negative for rash. Neurological: Negative for headaches, focal weakness or numbness. ____________   PHYSICAL EXAM:  VITAL SIGNS: ED Triage Vitals  Enc Vitals Group     BP 01/05/17 1142 116/71     Pulse Rate 01/05/17 1142 86     Resp 01/05/17 1142 18     Temp 01/05/17  1142 98.1 F (36.7 C)     Temp Source 01/05/17 1142 Oral     SpO2 01/05/17 1142 98 %     Weight 01/05/17 1140 145 lb (65.8 kg)     Height 01/05/17 1140 5\' 3"  (1.6 m)     Head Circumference --      Peak Flow --      Pain Score 01/05/17 1140 10     Pain Loc --      Pain Edu? --      Excl. in GC? --     Constitutional: Alert and oriented. Well appearing and in no acute distress. Mouth/Throat: Mucous membranes are moist.  Oropharynx non-erythematous. Multiple  devitalize teeth throughout her mouth. Patient points to a cracked tooth on the right upper molar #28 as a source of concern today. Neck: No stridor.   Cardiovascular: Normal rate, regular rhythm. Grossly normal heart sounds.  Good peripheral circulation. Respiratory: Normal respiratory effort.  No retractions. Lungs CTAB. Neurologic:  Normal speech and language. No gross focal neurologic deficits are appreciated. No gait instability. Skin:  Skin is warm, dry and intact. No rash noted. Psychiatric: Mood and affect are normal. Speech and behavior are normal.  ____________________________________________   LABS (all labs ordered are listed, but only abnormal results are displayed)  Labs Reviewed - No data to display ____________________________________________  EKG   ____________________________________________  RADIOLOGY  No results found.  ____________________________________________   PROCEDURES  Procedure(s) performed: None  Procedures  Critical Care performed: No  ____________________________________________   INITIAL IMPRESSION / ASSESSMENT AND PLAN / ED COURSE  Pertinent labs & imaging results that were available during my care of the patient were reviewed by me and considered in my medical decision making (see chart for details).  Dental pain secondary to fractured tooth. Patient given discharge care instructions. Patient advised [facility] with [facility] morning.      ____________________________________________   FINAL CLINICAL IMPRESSION(S) / ED DIAGNOSES  Final diagnoses:  Pain, dental      NEW MEDICATIONS STARTED DURING THIS VISIT:  New Prescriptions   AMOXICILLIN (AMOXIL) 500 MG CAPSULE    Take 1 capsule (500 mg total) by mouth 3 (three) times daily.   IBUPROFEN (ADVIL,MOTRIN) 600 MG TABLET    Take 1 tablet (600 mg total) by mouth every 6 (six) hours as needed.   LIDOCAINE (XYLOCAINE) 2 % SOLUTION    Use as directed 5  mLs in the mouth or throat every 6 (six) hours as needed for mouth pain.   TRAMADOL (ULTRAM) 50 MG TABLET    Take 1 tablet (50 mg total) by mouth every 6 (six) hours as needed for moderate pain.     Note:  This document was prepared using Dragon voice recognition software and may include unintentional dictation errors.    Joni ReiningSmith, [place] Nunley K, PA-C 01/05/17 1231    Minna AntisPaduchowski, Kevin, MD 01/05/17 1436

## 2017-01-05 NOTE — ED Triage Notes (Signed)
Pt arrived via POV from home reports right side dental pain for the passed several days states she may have abscess has hx of problem on the same side.  Took naproxen last night and did not help. Pt does not have a dentist.

## 2017-01-26 ENCOUNTER — Emergency Department
Admission: EM | Admit: 2017-01-26 | Discharge: 2017-01-27 | Disposition: A | Discharge status: Home / Self Care | Payer: Self-pay | Attending: Emergency Medicine | Admitting: Emergency Medicine

## 2017-01-26 ENCOUNTER — Encounter: Payer: Self-pay | Admitting: Emergency Medicine

## 2017-01-26 DIAGNOSIS — F17210 Nicotine dependence, cigarettes, uncomplicated: Secondary | ICD-10-CM | POA: Insufficient documentation

## 2017-01-26 DIAGNOSIS — Z79899 Other long term (current) drug therapy: Secondary | ICD-10-CM | POA: Insufficient documentation

## 2017-01-26 DIAGNOSIS — F4320 Adjustment disorder, unspecified: Secondary | ICD-10-CM | POA: Insufficient documentation

## 2017-01-26 HISTORY — DX: Depression, unspecified: F32.A

## 2017-01-26 HISTORY — DX: Attention-deficit hyperactivity disorder, unspecified type: F90.9

## 2017-01-26 HISTORY — DX: Major depressive disorder, single episode, unspecified: F32.9

## 2017-01-26 LAB — CBC
HCT: 39.8 % (ref 35.0–47.0)
Hemoglobin: 13.2 g/dL (ref 12.0–16.0)
MCH: 30.3 pg (ref 26.0–34.0)
MCHC: 33.2 g/dL (ref 32.0–36.0)
MCV: 91.3 fL (ref 80.0–100.0)
PLATELETS: 411 10*3/uL (ref 150–440)
RBC: 4.35 MIL/uL (ref 3.80–5.20)
RDW: 16.1 % — ABNORMAL HIGH (ref 11.5–14.5)
WBC: 12.1 10*3/uL — ABNORMAL HIGH (ref 3.6–11.0)

## 2017-01-26 LAB — COMPREHENSIVE METABOLIC PANEL
ALK PHOS: 61 U/L (ref 38–126)
ALT: 41 U/L (ref 14–54)
AST: 35 U/L (ref 15–41)
Albumin: 4.9 g/dL (ref 3.5–5.0)
Anion gap: 11 (ref 5–15)
BILIRUBIN TOTAL: 0.4 mg/dL (ref 0.3–1.2)
BUN: 12 mg/dL (ref 6–20)
CALCIUM: 10.3 mg/dL (ref 8.9–10.3)
CO2: 22 mmol/L (ref 22–32)
CREATININE: 0.79 mg/dL (ref 0.44–1.00)
Chloride: 107 mmol/L (ref 101–111)
GFR calc Af Amer: >60 mL/min (ref >60)
Glucose, Bld: 103 mg/dL — ABNORMAL HIGH (ref 65–99)
Potassium: 3.7 mmol/L (ref 3.5–5.1)
Sodium: 140 mmol/L (ref 135–145)
TOTAL PROTEIN: 8.8 g/dL — AB (ref 6.5–8.1)

## 2017-01-26 LAB — SALICYLATE LEVEL: Salicylate Lvl: <7 mg/dL (ref 2.8–30.0)

## 2017-01-26 LAB — ETHANOL: Alcohol, Ethyl (B): 49 mg/dL — ABNORMAL HIGH (ref <5)

## 2017-01-26 LAB — ACETAMINOPHEN LEVEL: Acetaminophen (Tylenol), Serum: <10 ug/mL — ABNORMAL LOW (ref 10–30)

## 2017-01-26 MED ORDER — NICOTINE 14 MG/24HR TD PT24
14.0000 mg | MEDICATED_PATCH | Freq: Every day | TRANSDERMAL | Status: DC
  Administered 2017-01-26: 14 mg via TRANSDERMAL
  Filled 2017-01-26: qty 1

## 2017-01-26 MED ORDER — LIDOCAINE HCL (PF) 1 % IJ SOLN
INTRAMUSCULAR | Status: AC
  Filled 2017-01-26: qty 10

## 2017-01-26 MED ORDER — LORAZEPAM 1 MG PO TABS
1.0000 mg | ORAL_TABLET | Freq: Once | ORAL | Status: AC
  Administered 2017-01-26: 1 mg via ORAL
  Filled 2017-01-26: qty 1

## 2017-01-26 NOTE — ED Notes (Signed)
22

## 2017-01-26 NOTE — Discharge Instructions (Signed)
Please seek medical attention for any high fevers, chest pain, shortness of breath, change in behavior, persistent vomiting, bloody stool or any other new or concerning symptoms.  

## 2017-01-26 NOTE — ED Notes (Signed)
[facility]

## 2017-01-26 NOTE — ED Notes (Signed)
Bedside/Suicide sitter order discontinued after confirmation from MD Derrill KayGoodman the order was not necessary. Order was placed in triage as part of protocol orders.
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
## 2017-01-26 NOTE — ED Notes (Signed)
BEHAVIORAL HEALTH ROUNDING  Patient sleeping: No.  Patient alert and oriented: yes  Behavior appropriate: Yes. ; If no, describe:  Nutrition and fluids offered: Yes  Toileting and hygiene offered: Yes  Sitter present: not applicable, Q 15 min safety rounds and observation.  Law enforcement present: Yes ODS  
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
## 2017-01-26 NOTE — ED Notes (Signed)
Pt dressed out into appropriate behavioral health clothing. Pt belongings consist of white tennis shoes, blue shorts, blue shirt, a purple and tan head wrap, a debit card, a black and tan bra a black cell phone, a bag of toiletries and a pair of black flip flops. Pt calm and cooperative while dressing out.
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
## 2017-01-26 NOTE — ED Notes (Signed)

## 2017-01-27 NOTE — ED Provider Notes (Signed)
-----------------------------------------   6:38 AM on 01/27/2017 -----------------------------------------  Patient remained in the emergency department overnight pending transport home or to shelter.   Irean HongSung, Salathiel Ferrara J, MD 01/27/17 [id number]

## 2017-01-27 NOTE — ED Notes (Signed)
BEHAVIORAL HEALTH ROUNDING Patient sleeping: Yes.   Patient alert and oriented: not applicable SLEEPING Behavior appropriate: Yes.  ; If no, describe: SLEEPING Nutrition and fluids offered: No SLEEPING Toileting and hygiene offered: NoSLEEPING Sitter present: not applicable, Q 15 min safety rounds and observation. Law enforcement present: Yes ODS 

## 2017-01-27 NOTE — ED Notes (Signed)
Spoke with [place] [employer] Officer Harle Stanfordicholson who is aware of the patient coming to the ED today [facility].

## 2017-01-27 NOTE — ED Notes (Addendum)
Attempt to call Mayer CamelCourtney [place] [phone]) message left to return call.
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
## 2017-01-27 NOTE — ED Notes (Signed)
Pt to be d/c per MD order. D/C instruction in hand. Clothes given to pt to change. Breakfast tray provided. Pt given phone to call for ride per request. [facility] to follow-up for possible shelter placement. Pt to d/c with brother per pt request.

## 2017-01-27 NOTE — ED Notes (Addendum)
Pt awake at this time. Explained to pt that she was for discharge from the ED but she would be able to stay here while we attempt to find a safe place for her to go. Pt is agreeable to go to a shelter at this time. Explained to pt that could take some time and we would work on trying to find her somewhere to go. Pt ok with this at this time.
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
## 2017-07-30 ENCOUNTER — Other Ambulatory Visit: Payer: Self-pay

## 2017-07-30 ENCOUNTER — Emergency Department (HOSPITAL_COMMUNITY)
Admission: EM | Admit: 2017-07-30 | Discharge: 2017-07-30 | Disposition: A | Discharge status: Home / Self Care | Payer: Self-pay | Attending: Emergency Medicine | Admitting: Emergency Medicine

## 2017-07-30 ENCOUNTER — Encounter (HOSPITAL_COMMUNITY): Payer: Self-pay

## 2017-07-30 DIAGNOSIS — F909 Attention-deficit hyperactivity disorder, unspecified type: Secondary | ICD-10-CM | POA: Insufficient documentation

## 2017-07-30 DIAGNOSIS — K0889 Other specified disorders of teeth and supporting structures: Secondary | ICD-10-CM | POA: Insufficient documentation

## 2017-07-30 DIAGNOSIS — F17210 Nicotine dependence, cigarettes, uncomplicated: Secondary | ICD-10-CM | POA: Insufficient documentation

## 2017-07-30 MED ORDER — PENICILLIN V POTASSIUM 500 MG PO TABS: 500.0000 mg | ORAL_TABLET | Freq: Four times a day (QID) | ORAL | 0 refills | Status: AC

## 2017-07-30 MED ORDER — OXYCODONE-ACETAMINOPHEN 5-325 MG PO TABS: 1.0000 | ORAL_TABLET | Freq: Three times a day (TID) | ORAL | 0 refills | Status: DC | PRN

## 2017-07-30 NOTE — ED Provider Notes (Signed)
[facility] Provider Note   CSN: [id number] Arrival date & time: 07/30/17  1833     History   Chief Complaint Chief Complaint  Patient presents with  . Dental Pain    HPI Andrea Guerra is a 27 y.o. female.  HPI   27 year old female presents today with complaints of dental pain.  Patient notes a significant past medical history of dental caries, numerous fractured teeth.  She notes that over the last several days she has had worsening pain at the left lower gumline and dentition in the right upper molars.  Patient denies any fever chills nausea or vomiting, swelling for the mouth or neck.  Patient reports the pain is severe, waking her throughout the night, over the counter medications are not improving her symptoms.    Past Medical History:  Diagnosis Date  . ADHD   . Depression     There are no active problems to display for this patient.   History reviewed. No pertinent surgical history.  OB History    No data available       Home Medications    Prior to Admission medications   Medication Sig Start Date End Date Taking? Authorizing Provider  acetaminophen (TYLENOL) 325 MG tablet Take 650 mg by mouth every 6 (six) hours as needed for mild pain, fever or headache.    [provider]  amoxicillin (AMOXIL) 500 MG capsule Take 1 capsule (500 mg total) by mouth 3 (three) times daily. 01/05/17   Joni ReiningSmith, Ronald K, PA-C  ibuprofen (ADVIL,MOTRIN) 600 MG tablet Take 1 tablet (600 mg total) by mouth every 6 (six) hours as needed. 01/05/17   Joni ReiningSmith, Ronald K, PA-C  ibuprofen (ADVIL,MOTRIN) 800 MG tablet Take 1 tablet (800 mg total) by mouth every 8 (eight) hours as needed for mild pain or moderate pain (with food). 10/07/16   Rockne MenghiniNorman, Anne-Caroline, MD  lidocaine (XYLOCAINE) 2 % solution Use as directed 5 mLs in the mouth or throat every 6 (six) hours as needed for mouth pain. 01/05/17   Joni ReiningSmith, Ronald K, PA-C  oxyCODONE (ROXICODONE) 5 MG  immediate release tablet Take 1 tablet (5 mg total) by mouth every 6 (six) hours as needed for severe pain. 11/29/16   Merrily Brittleifenbark, Neil, MD  oxyCODONE-acetaminophen (PERCOCET/ROXICET) 5-325 MG tablet Take 1 tablet by mouth every 8 (eight) hours as needed for severe pain. 07/30/17   Matika Bartell, Tinnie GensJeffrey, PA-C  penicillin v potassium (VEETID) 500 MG tablet Take 1 tablet (500 mg total) by mouth 4 (four) times daily for 10 days. 07/30/17 08/09/17  Doroteo Nickolson, Tinnie GensJeffrey, PA-C  traMADol (ULTRAM) 50 MG tablet Take 1 tablet (50 mg total) by mouth every 6 (six) hours as needed for moderate pain. 01/05/17   Joni ReiningSmith, Ronald K, PA-C    Family History No family history on file.  Social History Social History   Tobacco Use  . Smoking status: Current Every Day Smoker    Packs/day: 0.50    Types: Cigarettes  . Smokeless tobacco: Never Used  Substance Use Topics  . Alcohol use: Yes  . Drug use: No     Allergies   Patient has no known allergies.   Review of Systems Review of Systems  All other systems reviewed and are negative.    Physical Exam Updated Vital Signs BP 113/80 (BP Location: Right Arm)   Pulse 71   Temp 98.5 F (36.9 C) (Oral)   Resp 15   Ht 5\' 3"  (1.6 m)   Wt  65.8 kg (145 lb)   SpO2 100%   BMI 25.69 kg/m   Physical Exam  Constitutional: She is oriented to person, place, and time. She appears well-developed and well-nourished.  HENT:  Head: Normocephalic and atraumatic.  Numerous dental caries throughout, several fractured teeth, minor swelling along the left lower gumline, no fluctuance, floor mouth is soft, neck supple full active range of motion  Eyes: Conjunctivae are normal. Pupils are equal, round, and reactive to light. Right eye exhibits no discharge. Left eye exhibits no discharge. No scleral icterus.  Neck: Normal range of motion. No JVD present. No tracheal deviation present.  Pulmonary/Chest: Effort normal. No stridor.  Neurological: She is alert and oriented to person,  place, and time. Coordination normal.  Psychiatric: She has a normal mood and affect. Her behavior is normal. Judgment and thought content normal.  Nursing note and vitals reviewed.    ED Treatments / Results  Labs (all labs ordered are listed, but only abnormal results are displayed) Labs Reviewed - No data to display  EKG  EKG Interpretation None       Radiology No results found.  Procedures Procedures (including critical care time)  Medications Ordered in ED Medications - No data to display   Initial Impression / Assessment and Plan / ED Course  I have reviewed the triage vital signs and the nursing notes.  Pertinent labs & imaging results that were available during my care of the patient were reviewed by me and considered in my medical decision making (see chart for details).      Final Clinical Impressions(s) / ED Diagnoses   Final diagnoses:  Pain, dental   Labs:   Imaging:  Consults:  Therapeutics:  Discharge Meds:   Assessment/Plan: 27 year old female presents today with dental pain.  She does have minor swelling along the left lower gumline.  No obvious fluctuance.  She will be started on pain medication, antibiotics encouraged follow-up with dental resources.  Patient verbalized understanding and agreement to today's plan had no further questions or concerns at the time of discharge.      ED Discharge Orders        Ordered    oxyCODONE-acetaminophen (PERCOCET/ROXICET) 5-325 MG tablet  Every 8 hours PRN     07/30/17 2032    penicillin v potassium (VEETID) 500 MG tablet  4 times daily     07/30/17 2032       Eyvonne Mechanic, PA-C 07/30/17 2056    Arby Barrette, MD 08/08/17 1320

## 2017-07-30 NOTE — Discharge Instructions (Signed)
Please read attached information. If you experience any new or worsening signs or symptoms please return to the emergency room for evaluation. Please follow-up with your primary care provider or specialist as discussed. Please use medication prescribed only as directed and discontinue taking if you have any concerning signs or symptoms.   °

## 2017-07-30 NOTE — ED Triage Notes (Signed)
Pt states that she has several broken teeth that have been bothering her for a while, worried they may be getting infected

## 2017-09-24 ENCOUNTER — Encounter (HOSPITAL_COMMUNITY): Payer: Self-pay | Admitting: Family Medicine

## 2017-09-24 ENCOUNTER — Ambulatory Visit (HOSPITAL_COMMUNITY)
Admission: EM | Admit: 2017-09-24 | Discharge: 2017-09-24 | Disposition: A | Discharge status: Home / Self Care | Payer: Self-pay | Attending: Family Medicine | Admitting: Family Medicine

## 2017-09-24 DIAGNOSIS — R112 Nausea with vomiting, unspecified: Secondary | ICD-10-CM

## 2017-09-24 DIAGNOSIS — Z3202 Encounter for pregnancy test, result negative: Secondary | ICD-10-CM

## 2017-09-24 LAB — POCT PREGNANCY, URINE: Preg Test, Ur: NEGATIVE

## 2017-09-24 LAB — POCT I-STAT, CHEM 8
BUN: 5 mg/dL — ABNORMAL LOW (ref 6–20)
Calcium, Ion: 1.21 mmol/L (ref 1.15–1.40)
Chloride: 101 mmol/L (ref 101–111)
Creatinine, Ser: 0.7 mg/dL (ref 0.44–1.00)
Glucose, Bld: 102 mg/dL — ABNORMAL HIGH (ref 65–99)
HEMATOCRIT: 41 % (ref 36.0–46.0)
HEMOGLOBIN: 13.9 g/dL (ref 12.0–15.0)
POTASSIUM: 3.7 mmol/L (ref 3.5–5.1)
Sodium: 139 mmol/L (ref 135–145)
TCO2: 24 mmol/L (ref 22–32)

## 2017-09-24 LAB — POCT URINALYSIS DIP (DEVICE)
Glucose, UA: 100 mg/dL — AB
Ketones, ur: 80 mg/dL — AB
LEUKOCYTES UA: NEGATIVE
NITRITE: NEGATIVE
Protein, ur: NEGATIVE mg/dL
Specific Gravity, Urine: 1.02 (ref 1.005–1.030)
Urobilinogen, UA: >=8 mg/dL (ref 0.0–1.0)
pH: 6.5 (ref 5.0–8.0)

## 2017-09-24 MED ORDER — ONDANSETRON 4 MG PO TBDP: 4.0000 mg | ORAL_TABLET | Freq: Three times a day (TID) | ORAL | 0 refills | Status: DC | PRN

## 2017-09-24 MED ORDER — ONDANSETRON 4 MG PO TBDP
ORAL_TABLET | ORAL | Status: AC
  Filled 2017-09-24: qty 1

## 2017-09-24 MED ORDER — ONDANSETRON 4 MG PO TBDP
4.0000 mg | ORAL_TABLET | Freq: Once | ORAL | Status: AC
  Administered 2017-09-24: 4 mg via ORAL

## 2017-09-24 NOTE — ED Triage Notes (Signed)
Pt here for vomiting, abd pain, body aches and not eating since Friday. sts dx with hep C a few months ago. She was taking tylenol for back pain, worried that she may have taken too much. Also hx of kidney stones.

## 2017-09-24 NOTE — ED Provider Notes (Signed)
[facility]    CSN: [id number] Arrival date & time: 09/24/17  1605     History   Chief Complaint Chief Complaint  Patient presents with  . Vomiting    HPI Andrea Guerra is a 27 y.o. female history of hepatitis C presenting today with body aches, vomiting, and poor oral intake.  States that beginning Friday she started to have body aches in her back and into her legs, on Saturday she took approximately 12 Tylenol, 5-6 earlier in the day and 5-6 later on in the day.  In following day she developed some nausea and vomiting in inability to tolerate oral intake.  Since every time she has attempted to eat she has vomited.  She has not vomited today, but is also not attempted to eat.  She states she has a history of kidney stones.  HPI  Past Medical History:  Diagnosis Date  . ADHD   . Depression     There are no active problems to display for this patient.   History reviewed. No pertinent surgical history.  OB History    No data available       Home Medications    Prior to Admission medications   Medication Sig Start Date End Date Taking? Authorizing Provider  acetaminophen (TYLENOL) 325 MG tablet Take 650 mg by mouth every 6 (six) hours as needed for mild pain, fever or headache.    [provider]  ibuprofen (ADVIL,MOTRIN) 600 MG tablet Take 1 tablet (600 mg total) by mouth every 6 (six) hours as needed. 01/05/17   Joni Reining, PA-C  ibuprofen (ADVIL,MOTRIN) 800 MG tablet Take 1 tablet (800 mg total) by mouth every 8 (eight) hours as needed for mild pain or moderate pain (with food). 10/07/16   Rockne Menghini, MD  ondansetron (ZOFRAN ODT) 4 MG disintegrating tablet Take 1 tablet (4 mg total) by mouth every 8 (eight) hours as needed for nausea or vomiting. 09/24/17   Wieters, Junius Creamer, PA-C    Family History History reviewed. No pertinent family history.  Social History Social History   Tobacco Use  . Smoking status: Current Every Day Smoker     Packs/day: 0.50    Types: Cigarettes  . Smokeless tobacco: Never Used  Substance Use Topics  . Alcohol use: Yes  . Drug use: No     Allergies   Patient has no known allergies.   Review of Systems Review of Systems  Constitutional: Positive for appetite change. Negative for chills and fever.  HENT: Negative for ear pain and sore throat.   Eyes: Negative for pain and visual disturbance.  Respiratory: Negative for cough and shortness of breath.   Cardiovascular: Negative for chest pain and palpitations.  Gastrointestinal: Positive for abdominal pain, nausea and vomiting. Negative for constipation and diarrhea.  Genitourinary: Positive for flank pain. Negative for dysuria and hematuria.  Musculoskeletal: Positive for back pain and myalgias. Negative for arthralgias.  Skin: Negative for color change and rash.  Neurological: Positive for headaches. Negative for light-headedness.  All other systems reviewed and are negative.    Physical Exam Triage Vital Signs ED Triage Vitals  Enc Vitals Group     BP 09/24/17 1806 126/85     Pulse Rate 09/24/17 1806 100     Resp 09/24/17 1806 18     Temp 09/24/17 1806 98.8 F (37.1 C)     Temp src --      SpO2 09/24/17 1806 97 %  Weight --      Height --      Head Circumference --      Peak Flow --      Pain Score 09/24/17 1804 8     Pain Loc --      Pain Edu? --      Excl. in GC? --    No data found.  Updated Vital Signs BP 126/85   Pulse 100   Temp 98.8 F (37.1 C)   Resp 18   LMP 09/19/2017   SpO2 97%   Visual Acuity Right Eye Distance:   Left Eye Distance:   Bilateral Distance:    Right Eye Near:   Left Eye Near:    Bilateral Near:     Physical Exam  Constitutional: She appears well-developed and well-nourished. No distress.  HENT:  Head: Normocephalic and atraumatic.  Mouth/Throat: Oropharynx is clear and moist.  Eyes: Conjunctivae are normal.  Neck: Neck supple.  Cardiovascular: Normal rate and  regular rhythm.  No murmur heard. Pulmonary/Chest: Effort normal and breath sounds normal. No respiratory distress.  Breathing comfortably at rest, CTA BL  Abdominal: Soft. There is tenderness.  Abdomen is soft, nondistended, striae present across abdomen.  Patient not guarding during exam.  Tenderness to right upper quadrant, positive Murphy sign.  Nontender to left upper quadrant, left lower quadrant, right lower quadrant.  Negative rebound.  Musculoskeletal: She exhibits no edema.  Mild tenderness to palpation of lumbar musculature on the right.  Neurological: She is alert.  Skin: Skin is warm and dry.  Psychiatric: She has a normal mood and affect.  Nursing note and vitals reviewed.    UC Treatments / Results  Labs (all labs ordered are listed, but only abnormal results are displayed) Labs Reviewed  POCT URINALYSIS DIP (DEVICE) - Abnormal; Notable for the following components:      Result Value   Glucose, UA 100 (*)    Bilirubin Urine SMALL (*)    Ketones, ur 80 (*)    Hgb urine dipstick TRACE (*)    All other components within normal limits  POCT I-STAT, CHEM 8 - Abnormal; Notable for the following components:   BUN 5 (*)    Glucose, Bld 102 (*)    All other components within normal limits  POCT PREGNANCY, URINE    EKG  EKG Interpretation None       Radiology No results found.  Procedures Procedures (including critical care time)  Medications Ordered in UC Medications  ondansetron (ZOFRAN-ODT) disintegrating tablet 4 mg (4 mg Oral Given 09/24/17 1838)     Initial Impression / Assessment and Plan / UC Course  I have reviewed the triage vital signs and the nursing notes.  Pertinent labs & imaging results that were available during my care of the patient were reviewed by me and considered in my medical decision making (see chart for details).     Patient with nausea and vomiting status post ingesting large amount of Tylenol in setting of hepatitis C.  Urine  showing glucose, ketones, hemoglobin, bilirubin.  Possible dehydration, heart rate is 100.  No signs of infection in urine.  Discussed with patient possibilities for treatment.  Opted for trial of Zofran with oral challenge here in clinic, if tolerating will send home with Zofran and continue to monitor.  Advised that if she has any worsening abdominal pain no improvement in the next 24-48 hours she should go to emergency room.  Back pain appears musculoskeletal given reproduction on  exam, will advise ibuprofen instead of Tylenol.  Patient tolerated oral challenge well, feeling good and gaining appetite after eating crackers and water.  Stable for discharge.  Discussed strict return precautions. Patient verbalized understanding and is agreeable with plan.  Final Clinical Impressions(s) / UC Diagnoses   Final diagnoses:  Nausea and vomiting, intractability of vomiting not specified, unspecified vomiting type    ED Discharge Orders        Ordered    ondansetron (ZOFRAN ODT) 4 MG disintegrating tablet  Every 8 hours PRN     09/24/17 1921       Controlled Substance Prescriptions  Controlled Substance Registry consulted? Not Applicable   Lew Dawes, [place] 09/24/17 1924

## 2017-09-24 NOTE — Discharge Instructions (Signed)
For nausea: Zofran prescribed. Begin with every 6 hours, than as you are able to hold food down, take it as needed. Start with clear liquids, then move to plain foods like bananas, rice, applesauce, toast, broth, grits, oatmeal. As those food settle okay you may transition to your normal foods. Avoid spicy and greasy foods as much as possible.  Preventing dehydration is key! You need to replace the fluid your body is expelling. Drink plenty of fluids, may use Pedialyte or sports drinks.   Please return if you are experiencing blood in your vomit or stool or experiencing dizziness, lightheadedness, extreme fatigue, increased abdominal pain.   Please go to emergency room if you have worsening abdominal pain or no improvement in your abdominal pain in the next 24-48 hours.

## 2021-02-25 ENCOUNTER — Emergency Department (HOSPITAL_COMMUNITY): Payer: Self-pay

## 2021-02-25 ENCOUNTER — Encounter (HOSPITAL_COMMUNITY): Payer: Self-pay | Admitting: Emergency Medicine

## 2021-02-25 ENCOUNTER — Emergency Department (HOSPITAL_COMMUNITY)
Admission: EM | Admit: 2021-02-25 | Discharge: 2021-02-25 | Disposition: A | Discharge status: Home / Self Care | Payer: Self-pay | Attending: Emergency Medicine | Admitting: Emergency Medicine

## 2021-02-25 DIAGNOSIS — M25561 Pain in right knee: Secondary | ICD-10-CM | POA: Insufficient documentation

## 2021-02-25 DIAGNOSIS — F17210 Nicotine dependence, cigarettes, uncomplicated: Secondary | ICD-10-CM | POA: Insufficient documentation

## 2021-02-25 MED ORDER — NAPROXEN 250 MG PO TABS
500.0000 mg | ORAL_TABLET | Freq: Once | ORAL | Status: AC
  Administered 2021-02-25: 500 mg via ORAL
  Filled 2021-02-25: qty 2

## 2021-02-25 MED ORDER — NAPROXEN 500 MG PO TABS: 500.0000 mg | ORAL_TABLET | Freq: Two times a day (BID) | ORAL | 0 refills | Status: DC

## 2021-02-25 NOTE — ED Provider Notes (Signed)
[facility] EMERGENCY DEPARTMENT Provider Note   CSN: [id number] Arrival date & time: 02/25/21  1327     History No chief complaint on file.   Andrea Guerra is a 30 y.o. female.  30 year old female with complaint of right knee pain onset last night while asleep without known injury.  Pain is reported to the lateral right knee, no history of similar symptoms previously.  No other complaints or concerns today.      Past Medical History:  Diagnosis Date   ADHD    Depression     There are no problems to display for this patient.   History reviewed. No pertinent surgical history.   OB History   No obstetric history on file.     No family history on file.  Social History   Tobacco Use   Smoking status: Every Day    Packs/day: 0.50    Types: Cigarettes   Smokeless tobacco: Never  Substance Use Topics   Alcohol use: Yes   Drug use: Yes    Types: Marijuana    Home Medications Prior to Admission medications   Medication Sig Start Date End Date Taking? Authorizing Provider  naproxen (NAPROSYN) 500 MG tablet Take 1 tablet (500 mg total) by mouth 2 (two) times daily. 02/25/21  Yes Jeannie Fend, PA-C  acetaminophen (TYLENOL) 325 MG tablet Take 650 mg by mouth every 6 (six) hours as needed for mild pain, fever or headache.    [provider]  ibuprofen (ADVIL,MOTRIN) 600 MG tablet Take 1 tablet (600 mg total) by mouth every 6 (six) hours as needed. 01/05/17   Joni Reining, PA-C  ibuprofen (ADVIL,MOTRIN) 800 MG tablet Take 1 tablet (800 mg total) by mouth every 8 (eight) hours as needed for mild pain or moderate pain (with food). 10/07/16   Rockne Menghini, MD  ondansetron (ZOFRAN ODT) 4 MG disintegrating tablet Take 1 tablet (4 mg total) by mouth every 8 (eight) hours as needed for nausea or vomiting. 09/24/17   Wieters, Hallie C, PA-C    Allergies    Patient has no known allergies.  Review of Systems   Review of Systems   Constitutional:  Negative for fever.  Musculoskeletal:  Positive for arthralgias and gait problem. Negative for joint swelling.  Skin:  Negative for color change, rash and wound.  Allergic/Immunologic: Negative for immunocompromised state.  Neurological:  Negative for weakness and numbness.  Hematological:  Does not bruise/bleed easily.  Psychiatric/Behavioral:  Negative for confusion.   All other systems reviewed and are negative.  Physical Exam Updated Vital Signs BP 123/71 (BP Location: Left Arm)   Pulse 78   Temp 98.4 F (36.9 C) (Oral)   Resp 16   LMP 02/18/2021   SpO2 100%   Physical Exam Vitals and nursing note reviewed.  Constitutional:      General: She is not in acute distress.    Appearance: She is well-developed. She is not diaphoretic.  HENT:     Head: Normocephalic and atraumatic.  Cardiovascular:     Pulses: Normal pulses.  Pulmonary:     Effort: Pulmonary effort is normal.  Musculoskeletal:        General: Tenderness present. No swelling, deformity or signs of injury.     Right knee: Normal range of motion. Tenderness present. No lateral joint line or patellar tendon tenderness. No LCL laxity or MCL laxity.     Right lower leg: No edema.     Left lower leg: No  edema.  Skin:    General: Skin is warm and dry.     Findings: No bruising, erythema or rash.  Neurological:     Mental Status: She is alert and oriented to person, place, and time.     Sensory: No sensory deficit.     Motor: No weakness.  Psychiatric:        Behavior: Behavior normal.    ED Results / Procedures / Treatments   Labs (all labs ordered are listed, but only abnormal results are displayed) Labs Reviewed - No data to display  EKG None  Radiology DG Knee Complete 4 Views Right  Result Date: 02/25/2021 CLINICAL DATA:  Pain.  No recent injury. EXAM: RIGHT KNEE - COMPLETE 4+ VIEW COMPARISON:  None. FINDINGS: Osseous alignment is normal. Bone mineralization is normal. No fracture  line or displaced fracture fragment is seen. No degenerative change. No appreciable joint effusion and adjacent soft tissues are unremarkable. IMPRESSION: Negative. Electronically Signed   By: Bary Richard M.D.   On: 02/25/2021 14:50    Procedures Procedures   Medications Ordered in ED Medications  naproxen (NAPROSYN) tablet 500 mg (has no administration in time range)    ED Course  I have reviewed the triage vital signs and the nursing notes.  Pertinent labs & imaging results that were available during my care of the patient were reviewed by me and considered in my medical decision making (see chart for details).  Clinical Course as of 02/25/21 1535  Sat Feb 25, 2021  [id number] 30 year old female with complaint of lateral right knee tenderness.  Found to have lateral joint line tenderness on exam.  Without ligamentous laxity.  No calf swelling, tenderness, palpable cords, no edema.  X-ray is negative for bony injury or effusion.  Plan is to treat with naproxen, recommend crutches to weight-bear as tolerated follow-up with orthopedics if pain persist. [LM]    Clinical Course User Index [LM] Alden Hipp   MDM Rules/Calculators/A&P                           Final Clinical Impression(s) / ED Diagnoses Final diagnoses:  Acute pain of right knee    Rx / DC Orders ED Discharge Orders          Ordered    naproxen (NAPROSYN) 500 MG tablet  2 times daily        02/25/21 1532             Jeannie Fend, PA-C 02/25/21 1535    Sloan Leiter, DO 02/26/21 2202

## 2021-02-25 NOTE — ED Notes (Signed)
Pt discharged home per MD order. Discharge summary reviewed with pt, pt verbalizes understanding. No s/s of acute distress noted at discharge. Pt ambulatory off unit with crutch assist. Reports discharge ride home.

## 2021-02-25 NOTE — Discharge Instructions (Addendum)
Take naproxen as needed as prescribed for pain.  Use crutches to weight-bear as tolerated.  Recheck with orthopedics if not improving after 1 week.

## 2021-02-25 NOTE — ED Provider Notes (Signed)
Emergency Medicine Provider Triage Evaluation Note  Andrea Guerra , a 30 y.o. female  was evaluated in triage.  Pt complains of right lateral knee pain onset last night without injury. Worse with bearing weight  Review of Systems  Positive: Knee pain  Negative: Swelling, calf pain  Physical Exam  There were no vitals taken for this visit. Gen:   Awake, no distress   Resp:  Normal effort  MSK:   Moves extremities without difficulty  Other:  TTP lateral right knee, DP pulse present   Medical Decision Making  Medically screening exam initiated at 1:54 PM.  Appropriate orders placed.  Velvie Thomaston was informed that the remainder of the evaluation will be completed by another provider, this initial triage assessment does not replace that evaluation, and the importance of remaining in the ED until their evaluation is complete.     Jeannie Fend, PA-C 02/25/21 1356    Sloan Leiter, DO 02/26/21 1114

## 2021-02-25 NOTE — Progress Notes (Signed)
Orthopedic Tech Progress Note Patient Details:  Andrea Guerra February 01, 1991 [id number]  Ortho Devices Type of Ortho Device: Crutches Ortho Device/Splint Interventions: Adjustment   Post Interventions Patient Tolerated: Ambulated well  Quamere Mussell E Veronique Warga 02/25/2021, 4:03 PM

## 2021-02-25 NOTE — ED Triage Notes (Signed)
C/o R knee pain that started during the night while sleeping.  No known injury.

## 2021-05-01 ENCOUNTER — Emergency Department (HOSPITAL_COMMUNITY): Payer: 59

## 2021-05-01 ENCOUNTER — Other Ambulatory Visit: Payer: Self-pay

## 2021-05-01 ENCOUNTER — Encounter (HOSPITAL_COMMUNITY): Payer: Self-pay

## 2021-05-01 ENCOUNTER — Observation Stay (HOSPITAL_COMMUNITY)
Admission: EM | Admit: 2021-05-01 | Discharge: 2021-05-02 | Disposition: A | Discharge status: Home / Self Care | Payer: 59 | Attending: Emergency Medicine | Admitting: Emergency Medicine

## 2021-05-01 DIAGNOSIS — Z20822 Contact with and (suspected) exposure to covid-19: Secondary | ICD-10-CM | POA: Diagnosis not present

## 2021-05-01 DIAGNOSIS — F17210 Nicotine dependence, cigarettes, uncomplicated: Secondary | ICD-10-CM | POA: Insufficient documentation

## 2021-05-01 DIAGNOSIS — N189 Chronic kidney disease, unspecified: Secondary | ICD-10-CM | POA: Insufficient documentation

## 2021-05-01 DIAGNOSIS — T40601A Poisoning by unspecified narcotics, accidental (unintentional), initial encounter: Secondary | ICD-10-CM | POA: Diagnosis not present

## 2021-05-01 DIAGNOSIS — T402X1A Poisoning by other opioids, accidental (unintentional), initial encounter: Secondary | ICD-10-CM | POA: Diagnosis present

## 2021-05-01 DIAGNOSIS — G928 Other toxic encephalopathy: Secondary | ICD-10-CM | POA: Diagnosis present

## 2021-05-01 DIAGNOSIS — T50901A Poisoning by unspecified drugs, medicaments and biological substances, accidental (unintentional), initial encounter: Principal | ICD-10-CM | POA: Diagnosis present

## 2021-05-01 DIAGNOSIS — F1910 Other psychoactive substance abuse, uncomplicated: Secondary | ICD-10-CM | POA: Diagnosis present

## 2021-05-01 DIAGNOSIS — R0902 Hypoxemia: Secondary | ICD-10-CM | POA: Diagnosis present

## 2021-05-01 DIAGNOSIS — K7200 Acute and subacute hepatic failure without coma: Principal | ICD-10-CM | POA: Insufficient documentation

## 2021-05-01 DIAGNOSIS — R739 Hyperglycemia, unspecified: Secondary | ICD-10-CM | POA: Diagnosis present

## 2021-05-01 DIAGNOSIS — Z79899 Other long term (current) drug therapy: Secondary | ICD-10-CM | POA: Insufficient documentation

## 2021-05-01 DIAGNOSIS — D72829 Elevated white blood cell count, unspecified: Secondary | ICD-10-CM | POA: Insufficient documentation

## 2021-05-01 DIAGNOSIS — N179 Acute kidney failure, unspecified: Secondary | ICD-10-CM | POA: Diagnosis present

## 2021-05-01 LAB — MRSA NEXT GEN BY PCR, NASAL: MRSA by PCR Next Gen: NOT DETECTED

## 2021-05-01 LAB — BASIC METABOLIC PANEL
Anion gap: 8 (ref 5–15)
BUN: 16 mg/dL (ref 6–20)
CO2: 23 mmol/L (ref 22–32)
Calcium: 9.1 mg/dL (ref 8.9–10.3)
Chloride: 107 mmol/L (ref 98–111)
Creatinine, Ser: 1.12 mg/dL — ABNORMAL HIGH (ref 0.44–1.00)
GFR, Estimated: >60 mL/min (ref >60)
Glucose, Bld: 216 mg/dL — ABNORMAL HIGH (ref 70–99)
Potassium: 4.6 mmol/L (ref 3.5–5.1)
Sodium: 138 mmol/L (ref 135–145)

## 2021-05-01 LAB — GLUCOSE, CAPILLARY
Glucose-Capillary: 109 mg/dL — ABNORMAL HIGH (ref 70–99)
Glucose-Capillary: 169 mg/dL — ABNORMAL HIGH (ref 70–99)
Glucose-Capillary: 177 mg/dL — ABNORMAL HIGH (ref 70–99)

## 2021-05-01 LAB — CBC WITH DIFFERENTIAL/PLATELET
Abs Immature Granulocytes: 0.26 10*3/uL — ABNORMAL HIGH (ref 0.00–0.07)
Basophils Absolute: 0.1 10*3/uL (ref 0.0–0.1)
Basophils Relative: 0 %
Eosinophils Absolute: 0 10*3/uL (ref 0.0–0.5)
Eosinophils Relative: 0 %
HCT: 39.3 % (ref 36.0–46.0)
Hemoglobin: 12.4 g/dL (ref 12.0–15.0)
Immature Granulocytes: 1 %
Lymphocytes Relative: 4 %
Lymphs Abs: 1.3 10*3/uL (ref 0.7–4.0)
MCH: 32.3 pg (ref 26.0–34.0)
MCHC: 31.6 g/dL (ref 30.0–36.0)
MCV: 102.3 fL — ABNORMAL HIGH (ref 80.0–100.0)
Monocytes Absolute: 0.6 10*3/uL (ref 0.1–1.0)
Monocytes Relative: 2 %
Neutro Abs: 31.5 10*3/uL — ABNORMAL HIGH (ref 1.7–7.7)
Neutrophils Relative %: 93 %
Platelets: 291 10*3/uL (ref 150–400)
RBC: 3.84 MIL/uL — ABNORMAL LOW (ref 3.87–5.11)
RDW: 13.2 % (ref 11.5–15.5)
WBC: 33.7 10*3/uL — ABNORMAL HIGH (ref 4.0–10.5)
nRBC: 0 % (ref 0.0–0.2)

## 2021-05-01 LAB — HIV ANTIBODY (ROUTINE TESTING W REFLEX): HIV Screen 4th Generation wRfx: NONREACTIVE

## 2021-05-01 LAB — RESP PANEL BY RT-PCR (FLU A&B, COVID) ARPGX2
Influenza A by PCR: NEGATIVE
Influenza B by PCR: NEGATIVE
SARS Coronavirus 2 by RT PCR: NEGATIVE

## 2021-05-01 LAB — HEMOGLOBIN A1C
Hgb A1c MFr Bld: 5.3 % (ref 4.8–5.6)
Mean Plasma Glucose: 105.41 mg/dL

## 2021-05-01 LAB — ACETAMINOPHEN LEVEL: Acetaminophen (Tylenol), Serum: <10 ug/mL — ABNORMAL LOW (ref 10–30)

## 2021-05-01 LAB — CK: Total CK: 145 U/L (ref 38–234)

## 2021-05-01 LAB — SALICYLATE LEVEL: Salicylate Lvl: <7 mg/dL — ABNORMAL LOW (ref 7.0–30.0)

## 2021-05-01 MED ORDER — HEPARIN SODIUM (PORCINE) 5000 UNIT/ML IJ SOLN
5000.0000 [IU] | Freq: Three times a day (TID) | INTRAMUSCULAR | Status: DC
  Administered 2021-05-01 – 2021-05-02 (×2): 5000 [IU] via SUBCUTANEOUS
  Filled 2021-05-01 (×2): qty 1

## 2021-05-01 MED ORDER — SODIUM CHLORIDE 0.9 % IV SOLN
3.0000 g | Freq: Once | INTRAVENOUS | Status: AC
  Administered 2021-05-01: 3 g via INTRAVENOUS
  Filled 2021-05-01: qty 8

## 2021-05-01 MED ORDER — DOCUSATE SODIUM 100 MG PO CAPS: 100.0000 mg | ORAL_CAPSULE | Freq: Two times a day (BID) | ORAL | Status: DC | PRN

## 2021-05-01 MED ORDER — LACTATED RINGERS IV SOLN: INTRAVENOUS | Status: DC

## 2021-05-01 MED ORDER — POLYETHYLENE GLYCOL 3350 17 G PO PACK: 17.0000 g | PACK | Freq: Every day | ORAL | Status: DC | PRN

## 2021-05-01 MED ORDER — NALOXONE HCL 2 MG/2ML IJ SOSY
1.0000 mg | PREFILLED_SYRINGE | Freq: Once | INTRAMUSCULAR | Status: AC
  Administered 2021-05-01: 1 mg via INTRAVENOUS
  Filled 2021-05-01: qty 2

## 2021-05-01 MED ORDER — SODIUM CHLORIDE 0.9 % IV SOLN
3.0000 g | Freq: Four times a day (QID) | INTRAVENOUS | Status: DC
  Administered 2021-05-02: 3 g via INTRAVENOUS
  Filled 2021-05-01 (×3): qty 8

## 2021-05-01 MED ORDER — LACTATED RINGERS IV BOLUS
500.0000 mL | Freq: Once | INTRAVENOUS | Status: AC
  Administered 2021-05-01: 500 mL via INTRAVENOUS

## 2021-05-01 MED ORDER — NALOXONE HCL 4 MG/10ML IJ SOLN
0.5000 mg/h | INTRAVENOUS | Status: DC
  Administered 2021-05-01: 0.5 mg/h via INTRAVENOUS
  Filled 2021-05-01 (×2): qty 10

## 2021-05-01 MED ORDER — THIAMINE HCL 100 MG/ML IJ SOLN
100.0000 mg | Freq: Every day | INTRAMUSCULAR | Status: DC
  Administered 2021-05-01: 100 mg via INTRAVENOUS
  Filled 2021-05-01: qty 2

## 2021-05-01 MED ORDER — DICYCLOMINE HCL 10 MG PO CAPS
10.0000 mg | ORAL_CAPSULE | Freq: Once | ORAL | Status: AC
  Administered 2021-05-01: 10 mg via ORAL
  Filled 2021-05-01: qty 1

## 2021-05-01 NOTE — ED Triage Notes (Signed)
Pt. BIB [facility] following Oxycodone overdose. Unknown how much was taken 2mL narcan given prehospital.
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
## 2021-05-02 DIAGNOSIS — T40601A Poisoning by unspecified narcotics, accidental (unintentional), initial encounter: Secondary | ICD-10-CM | POA: Diagnosis not present

## 2021-05-02 LAB — CBC
HCT: 32.5 % — ABNORMAL LOW (ref 36.0–46.0)
Hemoglobin: 10.3 g/dL — ABNORMAL LOW (ref 12.0–15.0)
MCH: 32 pg (ref 26.0–34.0)
MCHC: 31.7 g/dL (ref 30.0–36.0)
MCV: 100.9 fL — ABNORMAL HIGH (ref 80.0–100.0)
Platelets: 216 10*3/uL (ref 150–400)
RBC: 3.22 MIL/uL — ABNORMAL LOW (ref 3.87–5.11)
RDW: 13.2 % (ref 11.5–15.5)
WBC: 10.2 10*3/uL (ref 4.0–10.5)
nRBC: 0 % (ref 0.0–0.2)

## 2021-05-02 LAB — BASIC METABOLIC PANEL
Anion gap: 3 — ABNORMAL LOW (ref 5–15)
BUN: 13 mg/dL (ref 6–20)
CO2: 25 mmol/L (ref 22–32)
Calcium: 8.7 mg/dL — ABNORMAL LOW (ref 8.9–10.3)
Chloride: 107 mmol/L (ref 98–111)
Creatinine, Ser: 0.59 mg/dL (ref 0.44–1.00)
GFR, Estimated: >60 mL/min (ref >60)
Glucose, Bld: 103 mg/dL — ABNORMAL HIGH (ref 70–99)
Potassium: 4 mmol/L (ref 3.5–5.1)
Sodium: 135 mmol/L (ref 135–145)

## 2021-05-02 LAB — GLUCOSE, CAPILLARY
Glucose-Capillary: 111 mg/dL — ABNORMAL HIGH (ref 70–99)
Glucose-Capillary: 97 mg/dL (ref 70–99)

## 2021-05-02 MED ORDER — CHLORHEXIDINE GLUCONATE CLOTH 2 % EX PADS: 6.0000 | MEDICATED_PAD | Freq: Every day | CUTANEOUS | Status: DC
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
## 2021-09-03 ENCOUNTER — Encounter (HOSPITAL_COMMUNITY): Payer: Self-pay | Admitting: Emergency Medicine

## 2021-09-03 ENCOUNTER — Emergency Department (HOSPITAL_COMMUNITY)
Admission: EM | Admit: 2021-09-03 | Discharge: 2021-09-03 | Disposition: A | Discharge status: Home / Self Care | Payer: 59 | Attending: Emergency Medicine | Admitting: Emergency Medicine

## 2021-09-03 ENCOUNTER — Other Ambulatory Visit: Payer: Self-pay

## 2021-09-03 ENCOUNTER — Emergency Department (HOSPITAL_COMMUNITY): Payer: 59

## 2021-09-03 DIAGNOSIS — N200 Calculus of kidney: Secondary | ICD-10-CM | POA: Insufficient documentation

## 2021-09-03 LAB — URINALYSIS, ROUTINE W REFLEX MICROSCOPIC
Bilirubin Urine: NEGATIVE
Glucose, UA: NEGATIVE mg/dL
Ketones, ur: NEGATIVE mg/dL
Leukocytes,Ua: NEGATIVE
Nitrite: POSITIVE — AB
Protein, ur: NEGATIVE mg/dL
Specific Gravity, Urine: 1.025 (ref 1.005–1.030)
pH: 6 (ref 5.0–8.0)

## 2021-09-03 LAB — COMPREHENSIVE METABOLIC PANEL
ALT: 24 U/L (ref 0–44)
AST: 27 U/L (ref 15–41)
Albumin: 4 g/dL (ref 3.5–5.0)
Alkaline Phosphatase: 65 U/L (ref 38–126)
Anion gap: 9 (ref 5–15)
BUN: 13 mg/dL (ref 6–20)
CO2: 26 mmol/L (ref 22–32)
Calcium: 9.5 mg/dL (ref 8.9–10.3)
Chloride: 105 mmol/L (ref 98–111)
Creatinine, Ser: 0.82 mg/dL (ref 0.44–1.00)
GFR, Estimated: >60 mL/min (ref >60)
Glucose, Bld: 110 mg/dL — ABNORMAL HIGH (ref 70–99)
Potassium: 3.9 mmol/L (ref 3.5–5.1)
Sodium: 140 mmol/L (ref 135–145)
Total Bilirubin: 0.3 mg/dL (ref 0.3–1.2)
Total Protein: 7.1 g/dL (ref 6.5–8.1)

## 2021-09-03 LAB — CBC
HCT: 36.2 % (ref 36.0–46.0)
Hemoglobin: 11.5 g/dL — ABNORMAL LOW (ref 12.0–15.0)
MCH: 29.6 pg (ref 26.0–34.0)
MCHC: 31.8 g/dL (ref 30.0–36.0)
MCV: 93.1 fL (ref 80.0–100.0)
Platelets: 359 10*3/uL (ref 150–400)
RBC: 3.89 MIL/uL (ref 3.87–5.11)
RDW: 14.6 % (ref 11.5–15.5)
WBC: 7.5 10*3/uL (ref 4.0–10.5)
nRBC: 0 % (ref 0.0–0.2)

## 2021-09-03 LAB — URINALYSIS, MICROSCOPIC (REFLEX)

## 2021-09-03 LAB — POC URINE PREG, ED: Preg Test, Ur: NEGATIVE

## 2021-09-03 LAB — I-STAT BETA HCG BLOOD, ED (MC, WL, AP ONLY): I-stat hCG, quantitative: <5 m[IU]/mL (ref <5)

## 2021-09-03 LAB — LIPASE, BLOOD: Lipase: 27 U/L (ref 11–51)

## 2021-09-03 MED ORDER — KETOROLAC TROMETHAMINE 15 MG/ML IJ SOLN
15.0000 mg | Freq: Once | INTRAMUSCULAR | Status: AC
  Administered 2021-09-03: 15 mg via INTRAVENOUS
  Filled 2021-09-03: qty 1

## 2021-09-03 MED ORDER — TAMSULOSIN HCL 0.4 MG PO CAPS: 0.4000 mg | ORAL_CAPSULE | Freq: Every day | ORAL | 0 refills | Status: AC

## 2021-09-03 MED ORDER — ONDANSETRON HCL 4 MG/2ML IJ SOLN
4.0000 mg | Freq: Once | INTRAMUSCULAR | Status: AC
  Administered 2021-09-03: 4 mg via INTRAVENOUS
  Filled 2021-09-03: qty 2

## 2021-09-03 MED ORDER — IBUPROFEN 400 MG PO TABS: 400.0000 mg | ORAL_TABLET | Freq: Four times a day (QID) | ORAL | 0 refills | Status: AC | PRN

## 2021-09-03 MED ORDER — LACTATED RINGERS IV BOLUS
1000.0000 mL | Freq: Once | INTRAVENOUS | Status: AC
  Administered 2021-09-03: 1000 mL via INTRAVENOUS

## 2021-09-03 MED ORDER — HYDROCODONE-ACETAMINOPHEN 5-325 MG PO TABS
1.0000 | ORAL_TABLET | ORAL | 0 refills | Status: DC | PRN
Start: 2021-09-03 — End: 2021-09-03

## 2021-09-03 NOTE — ED Provider Notes (Signed)
[facility] EMERGENCY DEPARTMENT Provider Note  CSN: [id number] Arrival Date & Time: 09/03/21  0859   History/HPI:  Chief Complaint  Patient presents with   Pelvic Pain   Dysuria    Andrea Guerra who presents today with complaint of flank and pelvic pain.  Pain started suddenly this morning.  Constant since onset.  Rates it as a 10/10.  Described as sharp.  She has had some associated difficulty with urination.  Denies any vaginal bleeding.  No vaginal discharge.  She has had some associated nausea but no vomiting.  Patient has a history of kidney stones in the past and states this feels similar.  Home Meds: Prior to Admission medications   Medication Sig Start Date End Date Taking? Authorizing Provider  ibuprofen (ADVIL) 400 MG tablet Take 1 tablet (400 mg total) by mouth every 6 (six) hours as needed. 09/03/21  Yes Jacelyn Pi, MD  tamsulosin (FLOMAX) 0.4 MG CAPS capsule Take 1 capsule (0.4 mg total) by mouth daily for 14 doses. 09/03/21 09/17/21 Yes Jacelyn Pi, MD    Allergies: Patient has no known allergies.  ROS: Review of Systems  Constitutional:  Negative for chills and fever.  Respiratory:  Negative for shortness of breath.   Cardiovascular:  Negative for chest pain.  Gastrointestinal:  Positive for abdominal pain and nausea. Negative for vomiting.  Genitourinary:  Positive for dysuria and flank pain. Negative for hematuria.  Neurological:  Negative for dizziness and headaches.   Physical Exam: Physical Exam Constitutional:      General: She is in acute distress.     Appearance: Normal appearance. She is not ill-appearing or toxic-appearing.  HENT:     Head: Normocephalic and atraumatic.     Mouth/Throat:     Mouth: Mucous membranes are moist.     Pharynx: Oropharynx is clear.  Eyes:     Pupils: Pupils are equal, round, and reactive to light.  Cardiovascular:     Rate and Rhythm: Normal rate and regular rhythm.     Pulses: Normal pulses.   Pulmonary:     Effort: Pulmonary effort is normal. No respiratory distress.     Breath sounds: Normal breath sounds. No wheezing.  Abdominal:     General: Abdomen is flat.     Palpations: Abdomen is soft.     Tenderness: There is abdominal tenderness (Suprapubic and right CVA tenderness).  Musculoskeletal:        General: Normal range of motion.     Cervical back: Normal range of motion.     Right lower leg: No edema.     Left lower leg: No edema.  Skin:    General: Skin is warm and dry.  Neurological:     General: No focal deficit present.     Mental Status: She is alert.    Procedures: Procedures   MDM/ED Course: This patient presents to the ED for concern of flank and pelvic pain, this involves an extensive number of treatment options, and is a complaint that carries with it a high risk of complications and morbidity.  The differential diagnosis includes UTI, pyelonephritis, kidney stone. Patients presentation is complicated by their history of history of kidney stones  Lab Tests: I Ordered, and personally interpreted labs.  The pertinent results include: Nitrites in urine but otherwise labs are unremarkable.  No other signs of UTI.  Imaging Studies ordered: I ordered imaging studies including CT scan stone study   I independently visualized and interpreted imaging which showed  small right UVJ stone. I agree with the radiologist interpretation  Medical Decision Making: Patient presented with flank and pelvic pain.  She has a history of kidney stone and her story is concerning for another kidney stone.  CT scan was ordered.  This showed a small right UVJ stone.  No associated urinary tract infection.  No leukocytosis.  She had good pain control with Toradol here in the emergency department.  She has tolerated p.o.  She has been able to urinate.  Plan for discharge home with outpatient urology follow-up.  Patient to be discharged with ibuprofen and Flomax.  Vital signs remained  stable and within normal limits.  Afebrile.  Complexity of problems addressed: Patients presentation is most consistent with  acute presentation with potential threat to life or bodily function  Disposition: After consideration of the diagnostic results and the patients response to treatment,  I feel that the patent would benefit from discharge home .   Patient seen in conjunction with my attending, Dr. Karle Starch.  Clinical Impression/Dx: Final diagnoses:  Kidney stone     Rx/Dc Orders: ED Discharge Orders          Ordered    HYDROcodone-acetaminophen (NORCO/VICODIN) 5-325 MG tablet  Every 4 hours PRN,   Status:  Discontinued        09/03/21 1156    tamsulosin (FLOMAX) 0.4 MG CAPS capsule  Daily        09/03/21 1156    ibuprofen (ADVIL) 400 MG tablet  Every 6 hours PRN        09/03/21 1156                 Jacelyn Pi, MD 09/03/21 1540    Truddie Hidden, MD 09/04/21 1536

## 2021-09-03 NOTE — ED Triage Notes (Signed)
Pt reports pelvic pain and difficulty urinating since this AM.

## 2023-08-16 IMAGING — CT CT RENAL STONE PROTOCOL
2 of 4 series · 14 of 46 positions shown, 16 images · non-contrast
Comparison: 11/29/2016

CLINICAL DATA: Flank pain.  Difficulty urinating.



[Series 3: ap without · axial · non-contrast · 0.58mm/px · z∈[-1184,-800]mm · 11 of 87 slices shown, 13 images]
[im 5/87  soft-tissue]
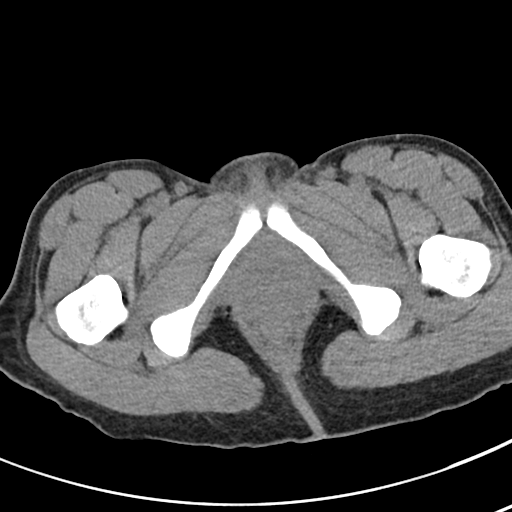
[im 5/87  bone]
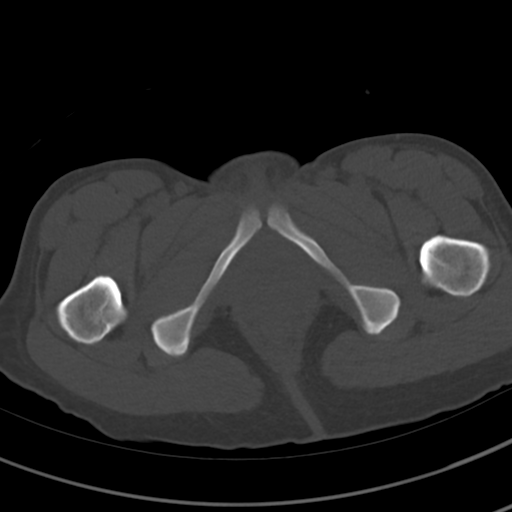
[im 14/87  soft-tissue]
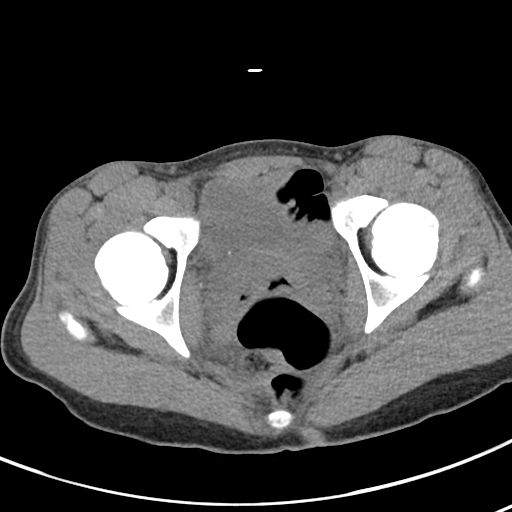
[im 23/87  soft-tissue]
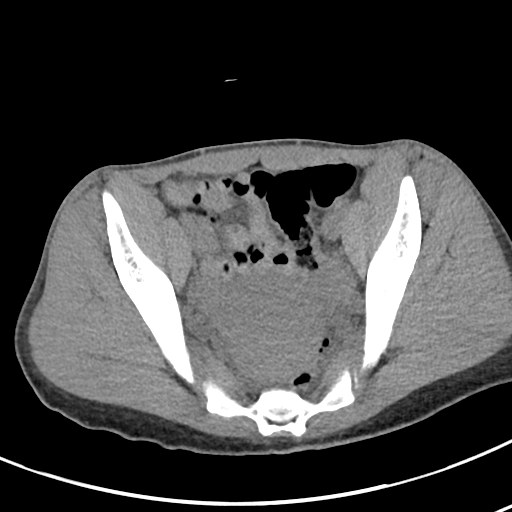
[im 28/87  soft-tissue]
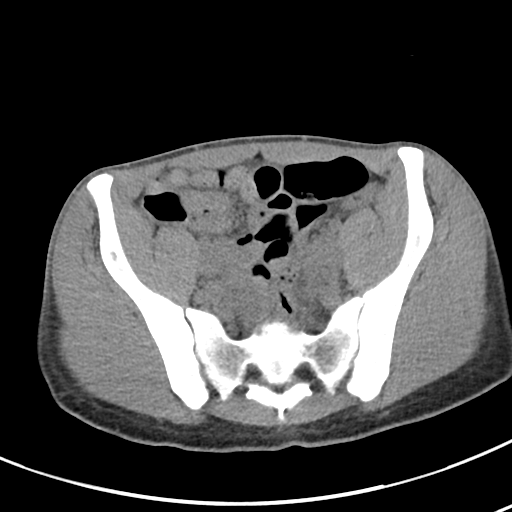
[im 37/87  soft-tissue]
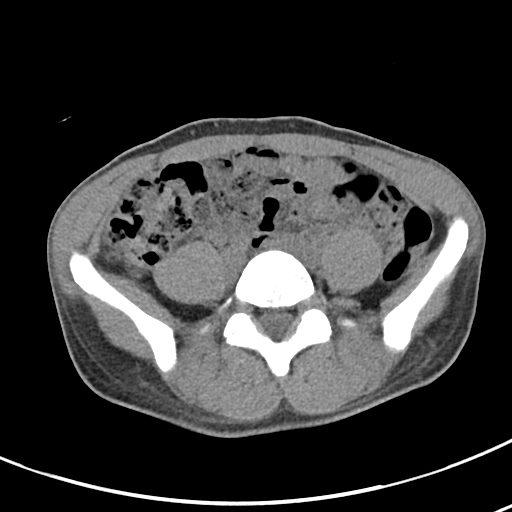
[im 46/87  soft-tissue]
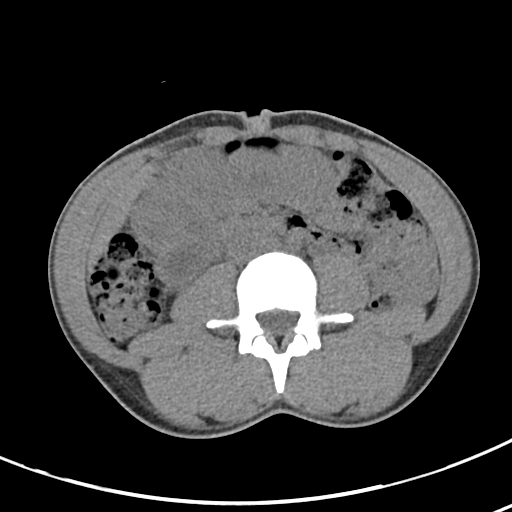
[im 50/87  soft-tissue]
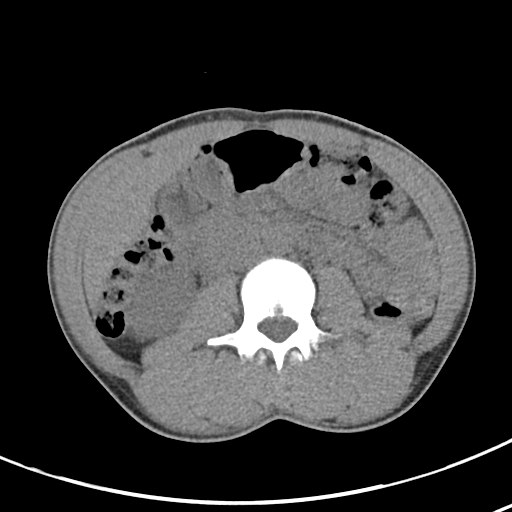
[im 59/87  soft-tissue]
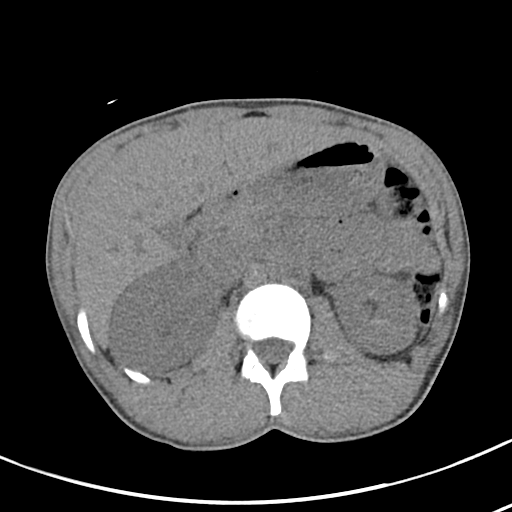
[im 64/87  soft-tissue]
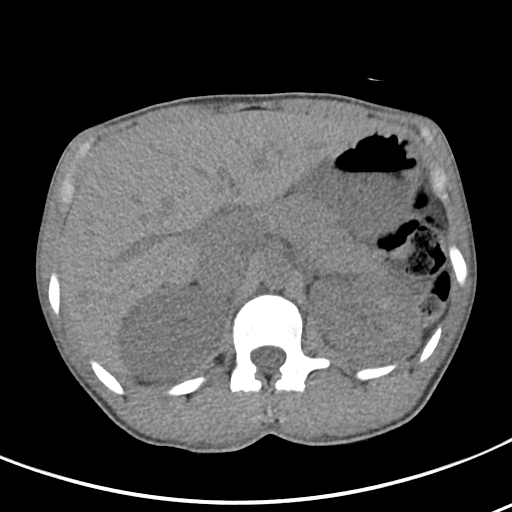
[im 64/87  bone]
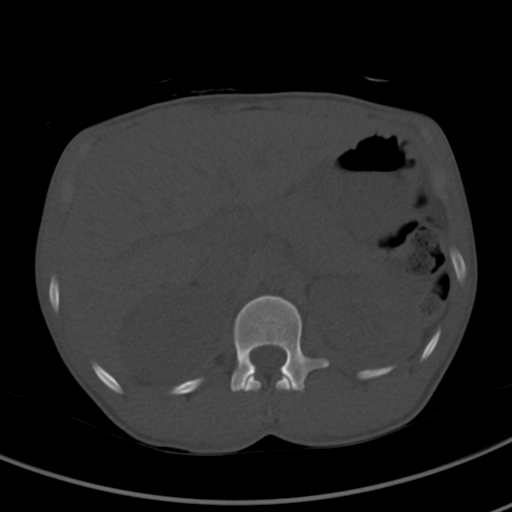
[im 73/87  soft-tissue]
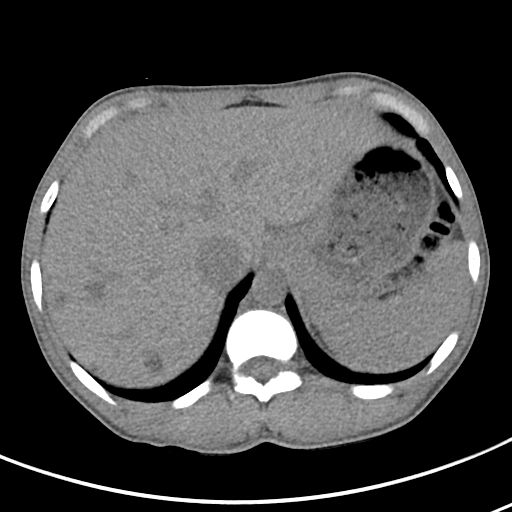
[im 82/87  soft-tissue]
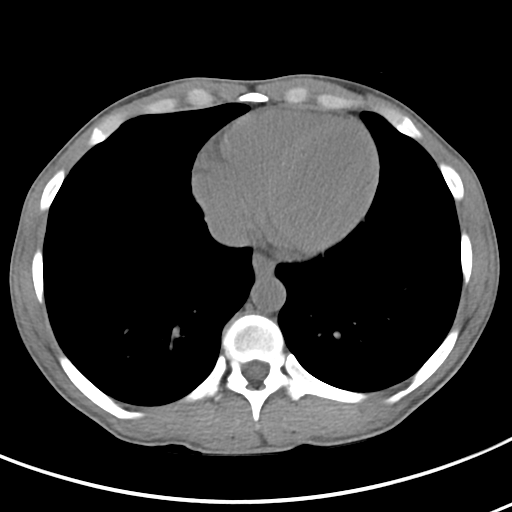

[Series 6: cor · coronal · 0.48mm/px · 3 of 76 slices shown]
[im 26/76  soft-tissue]
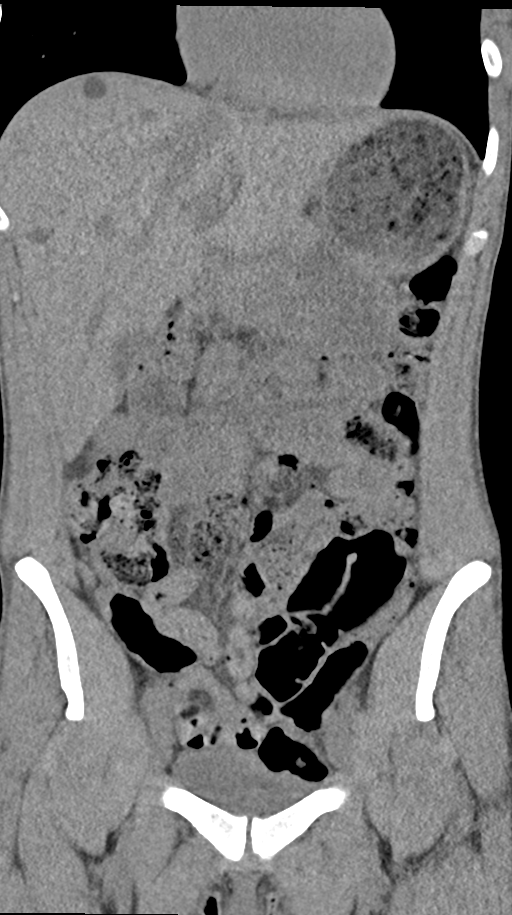
[im 34/76  soft-tissue]
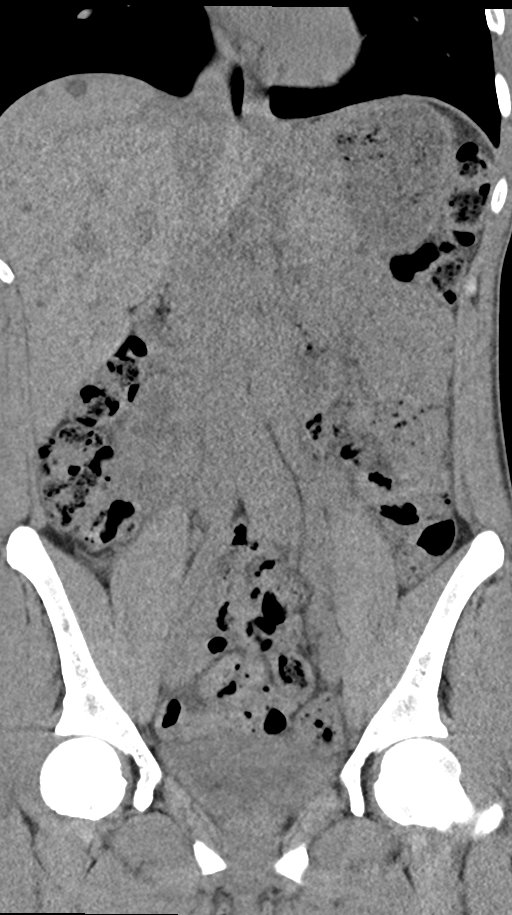
[im 42/76  soft-tissue]
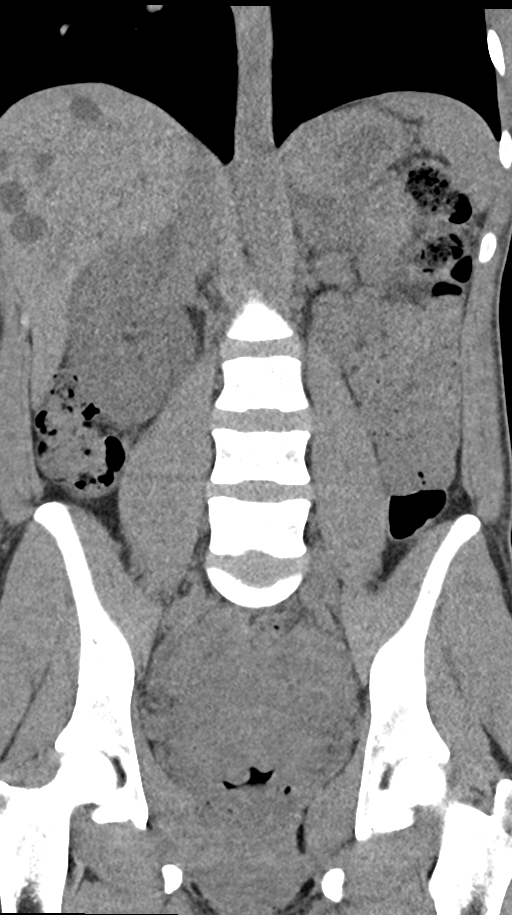

[14 of 46 positions shown; findings below may reference images not displayed]

FINDINGS: Lower chest: Unremarkable

Hepatobiliary: Scattered hypodensities in the liver parenchyma are
similar to prior, likely cyst. Gallbladder is nondistended. No
intrahepatic or extrahepatic biliary dilation.

Pancreas: No focal mass lesion. No dilatation of the main duct. No
intraparenchymal cyst. No peripancreatic edema.

Spleen: No splenomegaly. No focal mass lesion.

Adrenals/Urinary Tract: No adrenal nodule or mass. Left kidney
unremarkable. Left ureter not well seen. Right kidney appears
edematous with some fullness and ill definition of the intrarenal
collecting system and renal pelvis. Right ureter not discretely
visible. 1-2 mm stone is identified at the right UVJ (axial 74/3 and
sagittal 38/7).

Stomach/Bowel: Stomach is unremarkable. No gastric wall thickening.
No evidence of outlet obstruction. Duodenum is normally positioned
as is the ligament of Treitz. No small bowel wall thickening. No
small bowel dilatation. The appendix is best seen on coronal images
and is unremarkable. No gross colonic mass. No colonic wall
thickening.

Vascular/Lymphatic: No abdominal aortic aneurysm. No abdominal
lymphadenopathy no pelvic lymphadenopathy

Reproductive: Unremarkable.

Other: No intraperitoneal free fluid.

Musculoskeletal: No worrisome lytic or sclerotic osseous
abnormality.
IMPRESSION: 1-2 mm right UVJ stone with mild secondary changes in the right
kidney and ureter.
